# Patient Record
Sex: Male | Born: 1955 | Race: Black or African American | Hispanic: No | Marital: Married | Smoking: Former smoker
Health system: Southern US, Community
[De-identification: ages and names within clinical notes are randomized; demographics above are authoritative.]

## PROBLEM LIST (undated history)

## (undated) DIAGNOSIS — S0590XA Unspecified injury of unspecified eye and orbit, initial encounter: Secondary | ICD-10-CM

## (undated) DIAGNOSIS — I10 Essential (primary) hypertension: Secondary | ICD-10-CM

## (undated) DIAGNOSIS — C801 Malignant (primary) neoplasm, unspecified: Secondary | ICD-10-CM

## (undated) DIAGNOSIS — K7460 Unspecified cirrhosis of liver: Secondary | ICD-10-CM

## (undated) DIAGNOSIS — N200 Calculus of kidney: Secondary | ICD-10-CM

## (undated) DIAGNOSIS — K759 Inflammatory liver disease, unspecified: Secondary | ICD-10-CM

## (undated) HISTORY — PX: ABDOMINAL SURGERY: SHX537

## (undated) HISTORY — PX: OTHER SURGICAL HISTORY: SHX169

---
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
## 1998-08-24 DIAGNOSIS — K759 Inflammatory liver disease, unspecified: Secondary | ICD-10-CM

## 1998-08-24 HISTORY — PX: LEG SURGERY: SHX1003

## 1998-08-24 HISTORY — DX: Inflammatory liver disease, unspecified: K75.9

## 2000-05-24 ENCOUNTER — Encounter: Admission: RE | Admit: 2000-05-24 | Discharge: 2000-07-08 | Payer: Self-pay | Admitting: *Deleted

## 2000-06-30 ENCOUNTER — Encounter: Payer: Self-pay | Admitting: Emergency Medicine

## 2000-06-30 ENCOUNTER — Emergency Department (HOSPITAL_COMMUNITY): Admission: EM | Admit: 2000-06-30 | Discharge: 2000-06-30 | Payer: Self-pay | Admitting: Emergency Medicine

## 2000-07-20 ENCOUNTER — Encounter: Admission: RE | Admit: 2000-07-20 | Discharge: 2000-07-20 | Payer: Self-pay | Admitting: Urology

## 2000-07-20 ENCOUNTER — Encounter: Payer: Self-pay | Admitting: Urology

## 2002-04-27 ENCOUNTER — Emergency Department (HOSPITAL_COMMUNITY): Admission: EM | Admit: 2002-04-27 | Discharge: 2002-04-27 | Payer: Self-pay | Admitting: Emergency Medicine

## 2002-04-27 ENCOUNTER — Encounter: Payer: Self-pay | Admitting: Emergency Medicine

## 2002-05-08 ENCOUNTER — Ambulatory Visit (HOSPITAL_BASED_OUTPATIENT_CLINIC_OR_DEPARTMENT_OTHER): Admission: RE | Admit: 2002-05-08 | Discharge: 2002-05-08 | Payer: Self-pay | Admitting: Urology

## 2002-05-08 ENCOUNTER — Encounter: Payer: Self-pay | Admitting: Urology

## 2002-09-22 ENCOUNTER — Emergency Department (HOSPITAL_COMMUNITY): Admission: EM | Admit: 2002-09-22 | Discharge: 2002-09-22 | Payer: Self-pay | Admitting: Emergency Medicine

## 2003-01-21 ENCOUNTER — Encounter: Payer: Self-pay | Admitting: Emergency Medicine

## 2003-01-21 ENCOUNTER — Emergency Department (HOSPITAL_COMMUNITY): Admission: EM | Admit: 2003-01-21 | Discharge: 2003-01-21 | Payer: Self-pay | Admitting: Emergency Medicine

## 2003-05-09 ENCOUNTER — Emergency Department (HOSPITAL_COMMUNITY): Admission: EM | Admit: 2003-05-09 | Discharge: 2003-05-09 | Payer: Self-pay | Admitting: Emergency Medicine

## 2003-05-09 ENCOUNTER — Encounter: Payer: Self-pay | Admitting: Emergency Medicine

## 2003-07-08 ENCOUNTER — Emergency Department (HOSPITAL_COMMUNITY): Admission: EM | Admit: 2003-07-08 | Discharge: 2003-07-08 | Payer: Self-pay | Admitting: Emergency Medicine

## 2005-04-30 ENCOUNTER — Emergency Department (HOSPITAL_COMMUNITY): Admission: EM | Admit: 2005-04-30 | Discharge: 2005-05-01 | Payer: Self-pay | Admitting: Emergency Medicine

## 2007-01-21 ENCOUNTER — Emergency Department (HOSPITAL_COMMUNITY): Admission: EM | Admit: 2007-01-21 | Discharge: 2007-01-21 | Payer: Self-pay | Admitting: Emergency Medicine

## 2007-05-05 ENCOUNTER — Ambulatory Visit: Payer: Self-pay | Admitting: Gastroenterology

## 2007-05-05 LAB — CONVERTED CEMR LAB
Basophils Absolute: 0 10*3/uL (ref 0.0–0.1)
Basophils Relative: 0.6 % (ref 0.0–1.0)
Hemoglobin: 15.1 g/dL (ref 13.0–17.0)
INR: 1.2 — ABNORMAL HIGH (ref 0.8–1.0)
MCHC: 34.2 g/dL (ref 30.0–36.0)
Monocytes Relative: 6.8 % (ref 3.0–11.0)
Platelets: 139 10*3/uL — ABNORMAL LOW (ref 150–400)
Prothrombin Time: 13.5 s — ABNORMAL HIGH (ref 10.9–13.3)
RBC: 4.83 M/uL (ref 4.22–5.81)
RDW: 12.9 % (ref 11.5–14.6)
aPTT: 29 s (ref 21.7–29.8)

## 2007-05-09 ENCOUNTER — Inpatient Hospital Stay (HOSPITAL_COMMUNITY): Admission: EM | Admit: 2007-05-09 | Discharge: 2007-05-12 | Payer: Self-pay | Admitting: Emergency Medicine

## 2007-05-09 ENCOUNTER — Ambulatory Visit: Payer: Self-pay | Admitting: Infectious Diseases

## 2007-06-02 ENCOUNTER — Encounter: Payer: Self-pay | Admitting: Gastroenterology

## 2007-06-02 ENCOUNTER — Ambulatory Visit: Payer: Self-pay | Admitting: Gastroenterology

## 2007-11-10 DIAGNOSIS — D126 Benign neoplasm of colon, unspecified: Secondary | ICD-10-CM

## 2007-11-10 DIAGNOSIS — R011 Cardiac murmur, unspecified: Secondary | ICD-10-CM

## 2007-11-10 DIAGNOSIS — K219 Gastro-esophageal reflux disease without esophagitis: Secondary | ICD-10-CM

## 2007-11-10 DIAGNOSIS — R51 Headache: Secondary | ICD-10-CM

## 2007-11-10 DIAGNOSIS — G4730 Sleep apnea, unspecified: Secondary | ICD-10-CM | POA: Insufficient documentation

## 2007-11-10 DIAGNOSIS — R519 Headache, unspecified: Secondary | ICD-10-CM | POA: Insufficient documentation

## 2007-11-10 DIAGNOSIS — K921 Melena: Secondary | ICD-10-CM

## 2007-11-10 DIAGNOSIS — K648 Other hemorrhoids: Secondary | ICD-10-CM | POA: Insufficient documentation

## 2007-11-10 DIAGNOSIS — F329 Major depressive disorder, single episode, unspecified: Secondary | ICD-10-CM

## 2007-11-10 DIAGNOSIS — B1710 Acute hepatitis C without hepatic coma: Secondary | ICD-10-CM | POA: Insufficient documentation

## 2007-11-10 DIAGNOSIS — S82899A Other fracture of unspecified lower leg, initial encounter for closed fracture: Secondary | ICD-10-CM | POA: Insufficient documentation

## 2010-04-24 ENCOUNTER — Encounter (INDEPENDENT_AMBULATORY_CARE_PROVIDER_SITE_OTHER): Payer: Self-pay | Admitting: *Deleted

## 2010-09-23 NOTE — Letter (Signed)
Summary: Colonoscopy Letter  [facility]  [address] [place], [place] [address]   Phone: [phone]  Fax: [phone]      April 24, 2010 MRN: [id number]   CLEOPHUS MENDONSA [address] [place], [place]  [address]   Dear Mr. Crader,   According to your medical record, it is time for you to schedule a Colonoscopy. The American Cancer Society recommends this procedure as a method to detect early colon cancer. Patients with a family history of colon cancer, or a personal history of colon polyps or inflammatory bowel disease are at increased risk.  This letter has beeen generated based on the recommendations made at the time of your procedure. If you feel that in your particular situation this may no longer apply, please contact our office.  Please call our office at [phone] to schedule this appointment or to update your records at your earliest convenience.  Thank you for cooperating with [place] to provide you with the very best care possible.   Sincerely,  Judie Petit T. Russella Dar, M.D.  [facility] [phone]

## 2010-11-07 ENCOUNTER — Emergency Department (HOSPITAL_COMMUNITY)
Admission: EM | Admit: 2010-11-07 | Discharge: 2010-11-07 | Disposition: A | Discharge status: Home / Self Care | Payer: No Typology Code available for payment source | Attending: Emergency Medicine | Admitting: Emergency Medicine

## 2010-11-07 ENCOUNTER — Emergency Department (HOSPITAL_COMMUNITY): Payer: No Typology Code available for payment source

## 2010-11-07 DIAGNOSIS — M25559 Pain in unspecified hip: Secondary | ICD-10-CM | POA: Insufficient documentation

## 2010-11-07 DIAGNOSIS — B1920 Unspecified viral hepatitis C without hepatic coma: Secondary | ICD-10-CM | POA: Insufficient documentation

## 2010-11-07 DIAGNOSIS — E119 Type 2 diabetes mellitus without complications: Secondary | ICD-10-CM | POA: Insufficient documentation

## 2010-11-07 DIAGNOSIS — I10 Essential (primary) hypertension: Secondary | ICD-10-CM | POA: Insufficient documentation

## 2010-11-07 LAB — COMPREHENSIVE METABOLIC PANEL
ALT: 108 U/L — ABNORMAL HIGH (ref 0–53)
AST: 86 U/L — ABNORMAL HIGH (ref 0–37)
Albumin: 3.3 g/dL — ABNORMAL LOW (ref 3.5–5.2)
BUN: 12 mg/dL (ref 6–23)
Chloride: 101 mEq/L (ref 96–112)
GFR calc Af Amer: >60 mL/min (ref >60)
GFR calc non Af Amer: >60 mL/min (ref >60)
Potassium: 3.9 mEq/L (ref 3.5–5.1)
Sodium: 135 mEq/L (ref 135–145)
Total Protein: 6.9 g/dL (ref 6.0–8.3)

## 2010-11-07 LAB — CBC
MCH: 31.6 pg (ref 26.0–34.0)
Platelets: 68 10*3/uL — ABNORMAL LOW (ref 150–400)
RBC: 3.86 MIL/uL — ABNORMAL LOW (ref 4.22–5.81)
RDW: 13.1 % (ref 11.5–15.5)

## 2010-11-07 LAB — DIFFERENTIAL
Basophils Absolute: 0 10*3/uL (ref 0.0–0.1)
Eosinophils Relative: 0 % (ref 0–5)
Monocytes Absolute: 0.3 10*3/uL (ref 0.1–1.0)
Monocytes Relative: 9 % (ref 3–12)
Neutrophils Relative %: 19 % — ABNORMAL LOW (ref 43–77)

## 2010-11-07 LAB — PATHOLOGIST SMEAR REVIEW

## 2011-01-06 NOTE — Discharge Summary (Signed)
Stuart Moore, Stuart Moore NO.:  [id number]   MEDICAL RECORD NO.:  [id number]          PATIENT TYPE:  INP   LOCATION:  3711                         FACILITY:  [facility]   PHYSICIAN:  Edsel Petrin, D.O.DATE OF BIRTH:  [birth year]   DATE OF ADMISSION:  05/09/2007  DATE OF DISCHARGE:  05/12/2007                               DISCHARGE SUMMARY   DISCHARGE DIAGNOSES:  1. Hyperosmolar hypoglycemia state.  2. Diabetes mellitus, type 2, new diagnosis.  3. Probable vital myocytes with creatinine kinase elevation.  4. Diabetic retinopathy secondary to #1.  5. Thrombocytopenia, likely secondary to history of      interferon/ribavirin therapy.  6. Hepatitis C, last interferon/ribavirin therapy in June 2008.  7. Hypernatremia secondary to #1, resolved.  8. Elevated liver function tests secondary to hepatitis C.  9. Prerenal azotemia secondary to #1.  10.Neuropsychiatric symptoms/delirium secondary to #1.  11.Right leg ulcer, post fracture repair.  12.Group B streptococcal urinary tract infection.  13.Balanitis.  14.History of tobacco abuse.  15.Remote history of polysubstance abuse (alcohol and cocaine).  16.History of right hip replacement.  17.History of right eye trauma and blindness.   DISCHARGE MEDICATIONS:  1. Lantus injectable insulin pen, titration scale given, start with 20      units daily.  2. NovoLog injectable insulin 10, sliding scale will be provided for      meal coverage.  3. Amoxicillin 500 mg p.o. three times daily for 10 days.  4. Percocet 5 mg q.6 h p.r.n. for orthopedic pain.  5. Colace 100 mg p.o. daily for constipation.  6. Calcium and vitamin D supplement daily.  7. Omeprazole 20 mg p.o. daily.   DISPOSITION/FOLLOWUP:  Stuart Moore is being discharged in stable and  improved condition.  He has an appointment on Friday, May 13, 2007, at the [facility] with his orthopedist who is managing his right  lower extremity fracture.  Mr.  Alter has a primary care physician at  the [facility] with whom he will follow up with in the next 2-  3 days.  I will provide him with a copy of his discharge summary with  specific details for discharge outpatient care to be coordinated by the  [facility] practitioners.   ISSUES AT FOLLOWUP FOR PRIMARY CARE PHYSICIAN:  1. The patient is being discharged from the hospital with a Lantus      starter pack, Novolog sliding scale insulin starter pack and      glucometer with a limited amount of diabetes management supplies to      get him to his appointment with primary care at the [facility].  At the [facility],  he will need to be entered into a diabetes      education program, and his primary care physician can choose his      insulin or oral medications of choice in order to control his blood      sugars.  He is likely in a stunned state following his severe      hyperglycemia and hyperosmolality.  Therefore, we are recommending  several weeks on injectable insulin.  2. Urine culture while inpatient revealed Group B streptococcal      growth.  He will be treated with amoxicillin for 10 days starting      on September 17, ending September 27.  Repeat testing is      recommended if he is symptomatic.  3. The patient is to follow up with his orthopedic issues including a      right lower extremity wound and orthopedic hardware.  4. [name] will need to be seen at his Hepatis C Liver Clinic at      the [facility].  His current issues may be closely related to his      hepatitis C infection as well as his treatment with      interferon/ribivarin.  Comment:  Note, thrombocytopenia and      abnormal liver function tests during his hospitalization.   BRIEF ADMISSION HISTORY AND PHYSICAL:  Stuart Moore is a 55 year old  veteran with a past medical history significant for polysubstance abuse,  hepatitis C, GERD and multiple recent fractures to his lower  extremities  who was admitted to the [facility] after being evaluated in the  emergency department for capillary blood glucose readings of greater  than 1200.  He had complained of a viral-like illness prior to coming to  the emergency department for 2-3 days.  He has chronic bilateral lower  leg pain.  He denied any chest pain at the time of admission.  He did  complain of severe polyuria, polydipsia for the proceeding three days.  There was no hematuria, dysuria, pyuria.  He complained of fevers  greater than 100 and 101 at home.  He had a sore throat and some  shortness of breath as well as generalized muscle aches.  His family was  concerned that he was becoming very dehydrated and was having some  mental status changes.  Therefore, they brought him in for evaluation.   PHYSICAL EXAMINATION:  VITAL SIGNS:  On admission, his temperature was  98.3, blood pressure 119/83, pulse 121, respirations 18, O2 saturation  96% on room air.  GENERAL:  In no acute distress.  Obviously confused and disoriented.  HEENT:  Eye exam revealed trauma to his right eye, chronic.  His oral  mucosa was extremely dry with some evidence of friability and bleeding.  He had dry crackles in his lung bases, but there was no wheezing and  fair air movement.  His heart was regular with rate and rhythm with no  murmurs.  He was mildly tachycardic.  ABDOMEN:  Soft, mildly tender throughout with enlarged liver.  There was  no guarding or rebound.  Both of his legs were tender, especially on his  volar shins.  There was a 1 x 3 cm clean ulcer on his right lateral  malleolus.  He had minimal cervical adenopathy tender throughout his  paraspinals on his lower back.   LABORATORY DATA:  On admission, urinalysis:  Glucose greater than 1000,  ketones negative, trace blood, 0-2 RBCs, 0-2 WBCs.  Sodium 142,  potassium 5.4, note hemolysis, chloride 100, bicarbonate 29, BUN 47,  creatinine 2.4, glucose 1243.  WBC 13.1,  hemoglobin 17.1, platelets 129.   EKG on admission showed biatrial enlargement with sinus tachycardia.   HOSPITAL COURSE:  #1 - HYPEROSMOLAR HYPERGLYCEMIA STATE.  Mr. Oshita  had extremely elevated blood glucose levels on admission.  He was not a  previously known diabetic.  He was symptomatic  over several days.  He  was actually volume resuscitated 2-3 liters with normal saline.  He was  placed on a glucomander for stepwise control in lowering of his blood  glucose.  This was effective, and q.1 h CBGs and q.4 h B-mets were  monitored closely by the medical team.  Potassium was supplemented as  needed in his IV fluids.  He did have some transient hypernatremia  associated with correction of his electrolytes.  Therefore, his fluids  were changed to half normal saline.  As his hydration status improved,  his sodium did correct.  During this time, he did have some  neuropsychiatric symptoms which resolved with correction of his glucose  and sodium and additional electrolytes.  He may have been having  symptoms of PTSD with active auditory and visual hallucinations.  There  was no worrisome or combative behavior.  One day prior to his discharge,  he demonstrated consistently stable blood glucose control on sliding  scale insulin and long-acting Lantus at 20 units.  He has a normal basic  metabolic panel.  He did have prerenal azotemia with a creatinine of 2.4  at admission secondary to his severe volume loss due to hyperosmolar  diuresis.  This rapidly corrected with the infusion of IV fluids.  In  terms of an underlying source for precipitating his hyperosmolar  hyperglycemia, we performed blood cultures which were negative.  Urine  cultures did grow group B strep for which he is undergoing treatment  with amoxicillin.  There was no evidence of any infection regarding his  orthopedic injuries.  He may have also had a viral infection preceding  this event as he had transient elevation in  his creatinine kinase with a  CK level of 4000 which at the time of discharge is trending downward.  On chest x-ray, there was atelectasis and we could not rule out the  presence of an infiltrate.  Therefore, he was empirically treated for  two days with Avelox which was discontinued once the source of infection  was ruled out.  It may also be likely that his interferon/ribivarin  therapy with possible recurrence of his hepatitis C may have created a  flare of diabetes mellitus.   #2 - HEPATITIS C.  Mr.  Holzheimer recently completed one year of therapy  of interferon/ribavirin with his last dose being in June 2008.  He  states that since this continuing therapy, he has felt well until this  point.  His plans were to follow with the liver clinic in the outpatient  setting.  It was noted during his hospitalization that he had elevated  liver enzymes with an AST of 240 and ALT of 124 and an alk-phos of 129.  These elevations may be secondary to elevations in his creatinine  kinase.  Although, he did not appear to have actual rhabdomyolysis.  On  the day of admission, Mr. Snelling alk-phos was 256, his AST was 60,  ALT was 98, total protein 9.8, albumin 1.0.  We will perform hepatitis C  viral load and forward those results to the [facility] hospital for follow up as  his infection may not be responding to treatment.   #3 - THROMBOCYTOPENIA.  On admission, Mr. Zurn platelets were at  129.  Throughout his stay, they have decreased to 88.  This may be  multifactorial secondary to his liver disease.  I will also stop him on  his anticoagulation.  These will need to be followed again in the  outpatient setting.   #4 - ORTHOPEDIC ISSUES.  Mr. Barringer has chronic pain secondary to  many orthopedic injuries both historic and current.  He has a partially  healed right leg ulcer.  He is evaluated by the wound care team.  He  appears to be doing an excellent job at home caring for this wound.   He  has follow up with the orthopedist at the [facility] for  fracture repair of his right lower extremity.   DISCHARGE LABORATORIES:  Sodium 139, potassium 4.5, chloride 111,  bicarbonate 24, BUN 6, creatinine 0.9, glucose 300, alk-phos 129, AST  240, ALT 124, protein 7.6, albumin 3.0.  White blood cell count 10.2,  hemoglobin 14.3, platelets 88, cholesterol 137, LDL 76, HDL 60,  triglycerides 226.   DISCHARGE VITAL SIGNS:  Temperature 99, blood pressure 112/72, pulse 98,  respirations 20.      Edsel Petrin, D.O.  Electronically Signed     ELG/MEDQ  D:  05/11/2007  T:  05/11/2007  Job:  [id number]   cc:   [facility]

## 2011-01-06 NOTE — Consult Note (Signed)
NAME:  Stuart Moore, Stuart Moore NO.:  [id number]   MEDICAL RECORD NO.:  [id number]          PATIENT TYPE:  EMS   LOCATION:  ED                           FACILITY:  [facility]   PHYSICIAN:  Erasmo Leventhal, M.D.DATE OF BIRTH:  [birth year]   DATE OF CONSULTATION:  DATE OF DISCHARGE:                                 CONSULTATION   TIME:  10:30 p.m.   HISTORY OF PRESENT ILLNESS:  Stuart Moore is a 55 year old male  who fell tonight from a stage while watching his daughter in a  graduation event.  He injured his right ankle, could not ambulate on it.  He presented to [facility] emergency room.  He was evaluated by the ED  staff.  I was consulted for a right ankle fracture.  He complains only  of right ankle pain.  He denies injury or complaints of pain elsewhere.   ALLERGIES:  None.   CURRENT MEDICATIONS:  Vicodin, and he is also on hepatitis C treatment  through the [facility].   PAST MEDICAL HISTORY:  Hepatitis C as noted above, also right lower  extremity orthopedic procedures, multiple, at the [facility] where he  is very familiar with the people there.   PHYSICAL EXAMINATION:  GENERAL:  He is awake, alert, oriented to person,  place, time and circumstance.  Only complaint is right ankle pain.  EXTREMITIES:  Right lower extremity evaluated.  Knee:  Old scars; no  acute abnormality.  Leg:  Unremarkable.  Ankle:  Mild swelling, mild  lateral deformity.  Neurovascular examination is intact.  Compartments  are soft.  Achilles appeared to be intact.   X-ray is reviewed.  He has a trimalleolar ankle fracture/subluxation.   IMPRESSION:  Right ankle trimalleolar fracture/subluxation.   RECOMMENDATIONS:  I discussed with [name] that since he has had  something to drink at 7:00 nontender that he would be unable to undergo  anesthesia tonight until several hours from now.  With that in mind he  also asked me if he could go to the [facility], where he has  had all  of his other orthopedic treatment.  With that in mind we discussed  closed reduction.  He and his wife agreed to the procedure.   IV Dilaudid, 10 mL of 1% lidocaine, interarticular block, closed  reduction.  Better alignment, palpable and audible reduction.  No  neurovascular examination.   PLAN:  Closed reduction is done as above.  Elevation.  Ice.  Nonweightbearing.  Crutches will be dispensed.  Neurovascular checks.  Call for problems.  Prescription for Percocet and Robaxin.  DC  his  Vicodin for now.  Follow up on Monday either with me or the [facility]  for definitive treatment.  All questions encouraged and answered to the  patient and his wife in detail.           ______________________________  Erasmo Leventhal, M.D.     RAC/MEDQ  D:  01/21/2007  T:  01/22/2007  Job:  [id number]

## 2011-01-09 NOTE — Assessment & Plan Note (Signed)
[facility]                         GASTROENTEROLOGY OFFICE NOTE   DAL, BLEW                  MRN:          [id number]  DATE:05/05/2007                            DOB:          09-24-1955    PHYSICIAN REQUESTING CONSULTATION:  Dr. Caprice Renshaw   REASON FOR CONSULTATION:  Hemoccult positive stool. Family history of  colon cancer; GERD.   HISTORY OF PRESENT ILLNESS:  [name] is a 55 year old African-  American male who has a strong family history of colon cancer; he has  two brothers who developed colon cancer in their 59's.  He has another  brother who has had benign colon polyps.  He has had chronic GERD since  the 1980's and he is currently well controlled on omeprazole.  He has no  change in bowel habits, change in stool caliber, diarrhea, constipation,  melena, hematochezia, weight loss, nausea, vomiting, dysphagia or  odynophagia.  Recent stool Hemoccults were obtained during a physical  examination which were positive.   PAST MEDICAL HISTORY:  1. Hepatitis C status post 12 months of interferon and ribavirin;      recently completed.  2. GERD.  3. Status post recent right lower extremity fracture.   CURRENT MEDICATIONS:  Listed on the chart.   ALLERGIES:  No known drug allergies.   SOCIAL HISTORY/REVIEW OF SYSTEMS:  Per the handwritten form.   PHYSICAL EXAMINATION:  GENERAL:  Well-developed, well-nourished African-  American male in no acute distress.  VITAL SIGNS:  Height 5 feet, 8 inches, weight 161 pounds. Blood pressure  is 162/86, pulse 80 and regular.  HEENT EXAM:  Anicteric sclerae. Oropharynx clear.  CHEST:  Clear to auscultation bilaterally.  CARDIAC:  Regular rate and rhythm without murmurs appreciated.  ABDOMEN:  Soft and nontender with normal active bowel sounds, no  palpable organomegaly, masses or hernias.  The left lobe of the liver is  palpable in the epigastrium.  RECTAL EXAMINATION:   Deferred to time of colonoscopy.  NEUROLOGIC:  Alert and oriented x3.  Grossly nonfocal.   ASSESSMENT AND PLAN:  Hemoccult positive stool, colon cancer in two  brothers and chronic GERD. Rule out colorectal neoplasms, erosive  esophagitis, Barrett's esophagus and other disorders.  Will obtain a CBC  and PT, PTT today given his history of liver disease.  Risks, benefits  and alternatives to colonoscopy, possible biopsy, possible polypectomy  and upper  endoscopy with possible biopsy discussed with the patient and he  consents to proceed.  This will be scheduled electively.     Venita Lick. Russella Dar, MD, [facility]  Electronically Signed    MTS/MedQ  DD: 05/06/2007  DT: 05/07/2007  Job #: [id number]   cc:   Caprice Renshaw, M.D.

## 2011-06-04 LAB — BASIC METABOLIC PANEL
BUN: 14
BUN: 17
BUN: 25 — ABNORMAL HIGH
BUN: 34 — ABNORMAL HIGH
BUN: 36 — ABNORMAL HIGH
BUN: 37 — ABNORMAL HIGH
BUN: 39 — ABNORMAL HIGH
BUN: 40 — ABNORMAL HIGH
BUN: 45 — ABNORMAL HIGH
BUN: 47 — ABNORMAL HIGH
BUN: 7
BUN: 9
CO2: 22
CO2: 24
CO2: 24
CO2: 26
CO2: 26
CO2: 27
CO2: 28
CO2: 29
CO2: 30
Calcium: 11.9 — ABNORMAL HIGH
Calcium: 12.2 — ABNORMAL HIGH
Calcium: 8.1 — ABNORMAL LOW
Calcium: 8.4
Calcium: 8.4
Calcium: 8.5
Calcium: 8.7
Calcium: 8.7
Chloride: 108
Chloride: 111
Chloride: 112
Chloride: 112
Chloride: 113 — ABNORMAL HIGH
Chloride: 114 — ABNORMAL HIGH
Chloride: 115 — ABNORMAL HIGH
Chloride: 118 — ABNORMAL HIGH
Creatinine, Ser: 0.81
Creatinine, Ser: 0.86
Creatinine, Ser: 0.92
Creatinine, Ser: 0.99
Creatinine, Ser: 1.11
Creatinine, Ser: 1.26
Creatinine, Ser: 1.6 — ABNORMAL HIGH
Creatinine, Ser: 1.78 — ABNORMAL HIGH
Creatinine, Ser: 1.93 — ABNORMAL HIGH
Creatinine, Ser: 2.43 — ABNORMAL HIGH
GFR calc Af Amer: >60
GFR calc Af Amer: >60
GFR calc Af Amer: >60
GFR calc Af Amer: >60
GFR calc Af Amer: >60
GFR calc non Af Amer: 28 — ABNORMAL LOW
GFR calc non Af Amer: 37 — ABNORMAL LOW
GFR calc non Af Amer: 40 — ABNORMAL LOW
GFR calc non Af Amer: 47 — ABNORMAL LOW
GFR calc non Af Amer: >60
GFR calc non Af Amer: >60
GFR calc non Af Amer: >60
GFR calc non Af Amer: >60
Glucose, Bld: 1243
Glucose, Bld: 378 — ABNORMAL HIGH
Glucose, Bld: 416 — ABNORMAL HIGH
Glucose, Bld: 427 — ABNORMAL HIGH
Glucose, Bld: 462 — ABNORMAL HIGH
Glucose, Bld: 481 — ABNORMAL HIGH
Glucose, Bld: 783
Potassium: 3.5
Potassium: 3.7
Potassium: 3.8
Potassium: 4
Potassium: 4.3
Potassium: 4.3
Sodium: 133 — ABNORMAL LOW
Sodium: 145
Sodium: 147 — ABNORMAL HIGH
Sodium: 148 — ABNORMAL HIGH
Sodium: 157 — ABNORMAL HIGH

## 2011-06-04 LAB — I-STAT 8, (EC8 V) (CONVERTED LAB)
Acid-Base Excess: 2
Glucose, Bld: >700
TCO2: 29
pCO2, Ven: 45.8
pH, Ven: 7.386 — ABNORMAL HIGH

## 2011-06-04 LAB — CBC
HCT: 41
Hemoglobin: 15
MCHC: 33.3
MCHC: 33.4
MCV: 91.7
MCV: 92.3
MCV: 94.3
Platelets: 129 — ABNORMAL LOW
Platelets: 78 — ABNORMAL LOW
RBC: 4.66
RBC: 4.9
RBC: 5.57
RDW: 13.4
RDW: 13.5
WBC: 13.1 — ABNORMAL HIGH
WBC: 13.9 — ABNORMAL HIGH

## 2011-06-04 LAB — STREP A DNA PROBE: Group A Strep Probe: NEGATIVE

## 2011-06-04 LAB — RAPID URINE DRUG SCREEN, HOSP PERFORMED
Amphetamines: NOT DETECTED
Barbiturates: NOT DETECTED
Benzodiazepines: POSITIVE — AB
Cocaine: NOT DETECTED
Opiates: NOT DETECTED

## 2011-06-04 LAB — URINE MICROSCOPIC-ADD ON

## 2011-06-04 LAB — COMPREHENSIVE METABOLIC PANEL
ALT: 124 — ABNORMAL HIGH
ALT: 130 — ABNORMAL HIGH
ALT: 98 — ABNORMAL HIGH
AST: 226 — ABNORMAL HIGH
AST: 240 — ABNORMAL HIGH
Albumin: 2.7 — ABNORMAL LOW
Alkaline Phosphatase: 129 — ABNORMAL HIGH
BUN: 8
CO2: 23
CO2: 25
CO2: 27
Calcium: 11.5 — ABNORMAL HIGH
Calcium: 8.3 — ABNORMAL LOW
Calcium: 8.6
Calcium: 8.8
Chloride: 111
Creatinine, Ser: 0.87
GFR calc Af Amer: >60
GFR calc Af Amer: >60
GFR calc non Af Amer: 32 — ABNORMAL LOW
GFR calc non Af Amer: >60
Glucose, Bld: 1016
Glucose, Bld: 108 — ABNORMAL HIGH
Potassium: 3.1 — ABNORMAL LOW
Potassium: 4.7
Sodium: 140
Sodium: 144
Sodium: 152 — ABNORMAL HIGH
Total Bilirubin: 0.9
Total Protein: 6.2
Total Protein: 6.8

## 2011-06-04 LAB — LACTIC ACID, PLASMA: Lactic Acid, Venous: 3.1 — ABNORMAL HIGH

## 2011-06-04 LAB — PHOSPHORUS: Phosphorus: 2.2 — ABNORMAL LOW

## 2011-06-04 LAB — DIFFERENTIAL
Basophils Relative: 0
Eosinophils Absolute: 0
Lymphs Abs: 3.1
Neutrophils Relative %: 71

## 2011-06-04 LAB — POCT I-STAT CREATININE: Creatinine, Ser: 2.1 — ABNORMAL HIGH

## 2011-06-04 LAB — ETHANOL: Alcohol, Ethyl (B): <5

## 2011-06-04 LAB — CARDIAC PANEL(CRET KIN+CKTOT+MB+TROPI)
CK, MB: 4.1 — ABNORMAL HIGH
CK, MB: 8.3 — ABNORMAL HIGH
Relative Index: 0.2
Relative Index: 0.2
Troponin I: 0.06

## 2011-06-04 LAB — CULTURE, BLOOD (ROUTINE X 2)
Culture: NO GROWTH
Culture: NO GROWTH

## 2011-06-04 LAB — HCV RNA QUANT: HCV Quantitative Log: 4.88 — ABNORMAL HIGH (ref <0.70)

## 2011-06-04 LAB — URINE CULTURE: Colony Count: >=100000

## 2011-06-04 LAB — LIPID PANEL
HDL: 16 — ABNORMAL LOW
LDL Cholesterol: 76
Triglycerides: 226 — ABNORMAL HIGH

## 2011-06-04 LAB — AMMONIA: Ammonia: 21

## 2011-06-04 LAB — CLOSTRIDIUM DIFFICILE EIA

## 2011-06-04 LAB — CK TOTAL AND CKMB (NOT AT ARMC): Total CK: 498 — ABNORMAL HIGH

## 2011-06-04 LAB — URINALYSIS, ROUTINE W REFLEX MICROSCOPIC
Bilirubin Urine: NEGATIVE
Ketones, ur: NEGATIVE
Nitrite: NEGATIVE
Protein, ur: NEGATIVE
Specific Gravity, Urine: 1.036 — ABNORMAL HIGH
Urobilinogen, UA: 0.2

## 2011-06-04 LAB — CREATININE, URINE, RANDOM: Creatinine, Urine: 224.1

## 2011-06-04 LAB — HEMOGLOBIN A1C
Hgb A1c MFr Bld: 10.8 — ABNORMAL HIGH
Mean Plasma Glucose: 307

## 2011-06-04 LAB — PROTIME-INR: INR: 1.2

## 2011-06-04 LAB — HEPATITIS B SURFACE ANTIGEN: Hepatitis B Surface Ag: NEGATIVE

## 2011-06-04 LAB — MAGNESIUM: Magnesium: 2

## 2011-06-04 LAB — WOUND CULTURE: Gram Stain: NONE SEEN

## 2011-06-04 LAB — TSH: TSH: 0.69

## 2011-06-04 LAB — RAPID STREP SCREEN (MED CTR MEBANE ONLY): Streptococcus, Group A Screen (Direct): NEGATIVE

## 2011-11-17 ENCOUNTER — Emergency Department (HOSPITAL_COMMUNITY): Payer: No Typology Code available for payment source

## 2011-11-17 ENCOUNTER — Encounter (HOSPITAL_COMMUNITY): Payer: Self-pay | Admitting: *Deleted

## 2011-11-17 ENCOUNTER — Emergency Department (HOSPITAL_COMMUNITY)
Admission: EM | Admit: 2011-11-17 | Discharge: 2011-11-17 | Disposition: A | Discharge status: Home / Self Care | Payer: No Typology Code available for payment source | Attending: Emergency Medicine | Admitting: Emergency Medicine

## 2011-11-17 DIAGNOSIS — R109 Unspecified abdominal pain: Secondary | ICD-10-CM | POA: Insufficient documentation

## 2011-11-17 DIAGNOSIS — N201 Calculus of ureter: Secondary | ICD-10-CM | POA: Insufficient documentation

## 2011-11-17 DIAGNOSIS — E119 Type 2 diabetes mellitus without complications: Secondary | ICD-10-CM | POA: Insufficient documentation

## 2011-11-17 DIAGNOSIS — I10 Essential (primary) hypertension: Secondary | ICD-10-CM | POA: Insufficient documentation

## 2011-11-17 HISTORY — DX: Essential (primary) hypertension: I10

## 2011-11-17 LAB — CBC
MCV: 91.8 fL (ref 78.0–100.0)
Platelets: 154 10*3/uL (ref 150–400)
RBC: 4.76 MIL/uL (ref 4.22–5.81)
RDW: 13.7 % (ref 11.5–15.5)
WBC: 13.1 10*3/uL — ABNORMAL HIGH (ref 4.0–10.5)

## 2011-11-17 LAB — COMPREHENSIVE METABOLIC PANEL
AST: 60 U/L — ABNORMAL HIGH (ref 0–37)
Albumin: 4.1 g/dL (ref 3.5–5.2)
Alkaline Phosphatase: 120 U/L — ABNORMAL HIGH (ref 39–117)
BUN: 12 mg/dL (ref 6–23)
Creatinine, Ser: 0.99 mg/dL (ref 0.50–1.35)
Potassium: 3.9 mEq/L (ref 3.5–5.1)
Total Protein: 8.7 g/dL — ABNORMAL HIGH (ref 6.0–8.3)

## 2011-11-17 LAB — DIFFERENTIAL
Basophils Absolute: 0 10*3/uL (ref 0.0–0.1)
Lymphs Abs: 8.2 10*3/uL — ABNORMAL HIGH (ref 0.7–4.0)
Monocytes Relative: 7 % (ref 3–12)
Neutrophils Relative %: 28 % — ABNORMAL LOW (ref 43–77)

## 2011-11-17 LAB — URINALYSIS, ROUTINE W REFLEX MICROSCOPIC
Glucose, UA: NEGATIVE mg/dL
Nitrite: NEGATIVE
Protein, ur: NEGATIVE mg/dL
Urobilinogen, UA: 1 mg/dL (ref 0.0–1.0)
pH: 7 (ref 5.0–8.0)

## 2011-11-17 MED ORDER — SODIUM CHLORIDE 0.9 % IV SOLN
1000.0000 mL | Freq: Once | INTRAVENOUS | Status: AC
  Administered 2011-11-17: 1000 mL via INTRAVENOUS

## 2011-11-17 MED ORDER — HYDROMORPHONE HCL PF 1 MG/ML IJ SOLN
1.0000 mg | Freq: Once | INTRAMUSCULAR | Status: AC
  Administered 2011-11-17: 1 mg via INTRAVENOUS
  Filled 2011-11-17: qty 1

## 2011-11-17 MED ORDER — ONDANSETRON HCL 4 MG/2ML IJ SOLN
4.0000 mg | Freq: Once | INTRAMUSCULAR | Status: AC
  Administered 2011-11-17: 4 mg via INTRAVENOUS
  Filled 2011-11-17: qty 2

## 2011-11-17 MED ORDER — ONDANSETRON HCL 4 MG PO TABS: 4.0000 mg | ORAL_TABLET | Freq: Four times a day (QID) | ORAL | Status: AC

## 2011-11-17 MED ORDER — OXYCODONE-ACETAMINOPHEN 5-325 MG PO TABS: 1.0000 | ORAL_TABLET | Freq: Four times a day (QID) | ORAL | Status: AC | PRN

## 2011-11-17 MED ORDER — SODIUM CHLORIDE 0.9 % IV SOLN
1000.0000 mL | INTRAVENOUS | Status: DC
  Administered 2011-11-17: 1000 mL via INTRAVENOUS

## 2011-11-17 NOTE — Discharge Instructions (Signed)

## 2011-11-17 NOTE — ED Notes (Signed)
Pt from home via [facility] pt c/o L flank pain radiating to LUQ of abd onset today @ 19:00, pt denies CP, SOB, pt c/o N/V onset upon arrival to ED, will continue to monitor, prior to arrival pt took Oxycodone Rx for pain, NAD

## 2011-11-17 NOTE — ED Provider Notes (Signed)
History     CSN: [id number]  Arrival date & time 11/17/11  2002   First MD Initiated Contact with Patient 11/17/11 2013      Chief Complaint  Patient presents with  . Flank Pain    (Consider location/radiation/quality/duration/timing/severity/associated sxs/prior treatment) Patient is a 56 y.o. male presenting with flank pain. The history is provided by the patient.  Flank Pain This is a new problem. The current episode started 1 to 2 hours ago. The problem occurs constantly. The problem has not changed since onset.Associated symptoms include abdominal pain. Pertinent negatives include no headaches and no shortness of breath. The symptoms are aggravated by nothing. The symptoms are relieved by nothing. Treatments tried: oxycodone. The treatment provided no relief.  THe pain is in the left flank.  It radiates form the front to the back.  No trouble urinating.  No blood in the urine.  Past Medical History  Diagnosis Date  . Diabetes mellitus   . Hypertension     No past surgical history on file.  No family history on file.  History  Substance Use Topics  . Smoking status: Not on file  . Smokeless tobacco: Not on file  . Alcohol Use:       Review of Systems  Respiratory: Negative for shortness of breath.   Gastrointestinal: Positive for abdominal pain.  Genitourinary: Positive for flank pain.  Neurological: Negative for headaches.  All other systems reviewed and are negative.    Allergies  Review of patient's allergies indicates not on file.  Home Medications  No current outpatient prescriptions on file.  BP 158/83  Pulse 60  Temp(Src) 97.9 F (36.6 C) (Oral)  Resp 20  SpO2 100%  Physical Exam  Nursing note and vitals reviewed. Constitutional: He appears well-developed and well-nourished. No distress.  HENT:  Head: Normocephalic and atraumatic.  Right Ear: External ear normal.  Left Ear: External ear normal.  Eyes: Conjunctivae are normal. Right eye  exhibits no discharge. Left eye exhibits no discharge. No scleral icterus.  Neck: Neck supple. No tracheal deviation present.  Cardiovascular: Normal rate, regular rhythm and intact distal pulses.   Pulmonary/Chest: Effort normal and breath sounds normal. No stridor. No respiratory distress. He has no wheezes. He has no rales.  Abdominal: Soft. Bowel sounds are normal. He exhibits no distension. There is tenderness in the left upper quadrant. There is CVA tenderness. There is no rebound and no guarding.  Musculoskeletal: He exhibits no edema and no tenderness.  Neurological: He is alert. He has normal strength. No sensory deficit. Cranial nerve deficit:  no gross defecits noted. He exhibits normal muscle tone. He displays no seizure activity. Coordination normal.  Skin: Skin is warm and dry. No rash noted.  Psychiatric: He has a normal mood and affect.    ED Course  Procedures (including critical care time)  Medications  0.9 %  sodium chloride infusion (1000 mL Intravenous New Bag/Given 11/17/11 2029)    Followed by  0.9 %  sodium chloride infusion (not administered)  oxycodone (OXY-IR) 5 MG capsule (not administered)  insulin glargine (LANTUS) 100 UNIT/ML injection (not administered)  OMEPRAZOLE PO (not administered)  PRESCRIPTION MEDICATION (not administered)  PRESCRIPTION MEDICATION (not administered)  oxyCODONE-acetaminophen (PERCOCET) 5-325 MG per tablet (not administered)  ondansetron (ZOFRAN) 4 MG tablet (not administered)  HYDROmorphone (DILAUDID) injection 1 mg (1 mg Intravenous Given 11/17/11 2032)  ondansetron (ZOFRAN) injection 4 mg (4 mg Intravenous Given 11/17/11 2031)    Labs Reviewed  CBC - Abnormal;  Notable for the following:    WBC 13.1 (*)    All other components within normal limits  DIFFERENTIAL - Abnormal; Notable for the following:    Neutrophils Relative 28 (*)    Lymphocytes Relative 63 (*)    Lymphs Abs 8.2 (*)    All other components within normal limits    COMPREHENSIVE METABOLIC PANEL - Abnormal; Notable for the following:    Glucose, Bld 163 (*)    Total Protein 8.7 (*)    AST 60 (*) HEMOLYSIS AT THIS LEVEL MAY AFFECT RESULT   ALT 56 (*)    Alkaline Phosphatase 120 (*)    GFR calc non Af Amer 90 (*)    All other components within normal limits  URINALYSIS, ROUTINE W REFLEX MICROSCOPIC - Abnormal; Notable for the following:    Hgb urine dipstick MODERATE (*)    All other components within normal limits  LIPASE, BLOOD  URINE MICROSCOPIC-ADD ON   Ct Abdomen Pelvis Wo Contrast  11/17/2011  *RADIOLOGY REPORT*  Clinical Data: 56 year old male with left flank, abdominal and pelvic pain.  CT ABDOMEN AND PELVIS WITHOUT CONTRAST  Technique:  Multidetector CT imaging of the abdomen and pelvis was performed following the standard protocol without intravenous contrast.  Comparison: 05/09/2007  Findings: The liver has a nodular contour compatible with cirrhosis. No focal hepatic abnormalities are identified. The spleen, pancreas, adrenal glands, and gallbladder are unremarkable.  Mild to moderate left hydroureteronephrosis is identified caused by a 5 mm mid left ureteral calculus, above the iliac crossover.  Multiple nonobstructing bilateral renal calculi are identified  Please note that parenchymal abnormalities may be missed as intravenous contrast was not administered. No free fluid, enlarged lymph nodes, biliary dilation or abdominal aortic aneurysm identified. The bowel, appendix and bladder are unremarkable.  No acute or suspicious bony abnormalities are identified.  Surgical hardware within the proximal right femur is noted.  IMPRESSION: 5 mm mid left ureteral calculus causing mild to moderate left hydroureteronephrosis.  Nonobstructing bilateral renal calculi.  Cirrhosis.  Original Report Authenticated By: Rosendo Gros, M.D.     1. Ureteral stone       MDM  Patient is feeling better after treatment in emergency room for renal colic. He is no  longer having any pain. I discussed the findings with the patient and his family members. He'll be discharged home with a prescription for oxycodone and Zofran. I have given him a referral to urology.       Celene Kras, MD 11/17/11 2245

## 2011-11-18 ENCOUNTER — Encounter (HOSPITAL_COMMUNITY): Payer: Self-pay | Admitting: *Deleted

## 2011-11-18 ENCOUNTER — Ambulatory Visit (HOSPITAL_COMMUNITY)
Admission: AD | Admit: 2011-11-18 | Discharge: 2011-11-18 | Disposition: A | Discharge status: Home / Self Care | Payer: No Typology Code available for payment source | Source: Ambulatory Visit | Attending: Urology | Admitting: Urology

## 2011-11-18 ENCOUNTER — Other Ambulatory Visit: Payer: Self-pay

## 2011-11-18 ENCOUNTER — Other Ambulatory Visit: Payer: Self-pay | Admitting: Urology

## 2011-11-18 ENCOUNTER — Ambulatory Visit (HOSPITAL_COMMUNITY): Payer: No Typology Code available for payment source | Admitting: Anesthesiology

## 2011-11-18 ENCOUNTER — Encounter (HOSPITAL_COMMUNITY)
Admission: AD | Disposition: A | Discharge status: Home / Self Care | Payer: Self-pay | Source: Ambulatory Visit | Attending: Urology

## 2011-11-18 ENCOUNTER — Encounter (HOSPITAL_COMMUNITY): Payer: Self-pay | Admitting: Anesthesiology

## 2011-11-18 ENCOUNTER — Ambulatory Visit (HOSPITAL_COMMUNITY): Payer: No Typology Code available for payment source

## 2011-11-18 DIAGNOSIS — Z01812 Encounter for preprocedural laboratory examination: Secondary | ICD-10-CM | POA: Insufficient documentation

## 2011-11-18 DIAGNOSIS — B1920 Unspecified viral hepatitis C without hepatic coma: Secondary | ICD-10-CM | POA: Insufficient documentation

## 2011-11-18 DIAGNOSIS — Z79899 Other long term (current) drug therapy: Secondary | ICD-10-CM | POA: Insufficient documentation

## 2011-11-18 DIAGNOSIS — Z01810 Encounter for preprocedural cardiovascular examination: Secondary | ICD-10-CM | POA: Insufficient documentation

## 2011-11-18 DIAGNOSIS — E119 Type 2 diabetes mellitus without complications: Secondary | ICD-10-CM | POA: Insufficient documentation

## 2011-11-18 DIAGNOSIS — N201 Calculus of ureter: Secondary | ICD-10-CM

## 2011-11-18 HISTORY — DX: Unspecified cirrhosis of liver: K74.60

## 2011-11-18 HISTORY — DX: Unspecified injury of unspecified eye and orbit, initial encounter: S05.90XA

## 2011-11-18 HISTORY — DX: Inflammatory liver disease, unspecified: K75.9

## 2011-11-18 LAB — GLUCOSE, CAPILLARY
Glucose-Capillary: 113 mg/dL — ABNORMAL HIGH (ref 70–99)
Glucose-Capillary: 165 mg/dL — ABNORMAL HIGH (ref 70–99)

## 2011-11-18 LAB — COMPREHENSIVE METABOLIC PANEL
ALT: 51 U/L (ref 0–53)
AST: 47 U/L — ABNORMAL HIGH (ref 0–37)
Albumin: 4 g/dL (ref 3.5–5.2)
Alkaline Phosphatase: 105 U/L (ref 39–117)
BUN: 10 mg/dL (ref 6–23)
Chloride: 101 mEq/L (ref 96–112)
Potassium: 3.9 mEq/L (ref 3.5–5.1)
Sodium: 139 mEq/L (ref 135–145)
Total Bilirubin: 0.4 mg/dL (ref 0.3–1.2)
Total Protein: 8.5 g/dL — ABNORMAL HIGH (ref 6.0–8.3)

## 2011-11-18 LAB — SURGICAL PCR SCREEN
MRSA, PCR: NEGATIVE
Staphylococcus aureus: NEGATIVE

## 2011-11-18 LAB — PATHOLOGIST SMEAR REVIEW

## 2011-11-18 SURGERY — CYSTOURETEROSCOPY, WITH RETROGRADE PYELOGRAM AND STENT INSERTION
Anesthesia: General | Site: Ureter | Laterality: Left | Wound class: Clean Contaminated

## 2011-11-18 MED ORDER — LIDOCAINE HCL (CARDIAC) 20 MG/ML IV SOLN
INTRAVENOUS | Status: DC | PRN
  Administered 2011-11-18: 80 mg via INTRAVENOUS

## 2011-11-18 MED ORDER — IOHEXOL 300 MG/ML  SOLN
INTRAMUSCULAR | Status: AC
  Filled 2011-11-18: qty 1

## 2011-11-18 MED ORDER — PROMETHAZINE HCL 25 MG/ML IJ SOLN: 6.2500 mg | INTRAMUSCULAR | Status: DC | PRN

## 2011-11-18 MED ORDER — MUPIROCIN 2 % EX OINT
TOPICAL_OINTMENT | CUTANEOUS | Status: AC
  Filled 2011-11-18: qty 22

## 2011-11-18 MED ORDER — FENTANYL CITRATE 0.05 MG/ML IJ SOLN
INTRAMUSCULAR | Status: DC | PRN
  Administered 2011-11-18 (×3): 50 ug via INTRAVENOUS

## 2011-11-18 MED ORDER — LACTATED RINGERS IV SOLN
INTRAVENOUS | Status: DC | PRN
  Administered 2011-11-18: 20:00:00 via INTRAVENOUS

## 2011-11-18 MED ORDER — LACTATED RINGERS IV SOLN
INTRAVENOUS | Status: DC
  Administered 2011-11-18: 22:00:00 via INTRAVENOUS

## 2011-11-18 MED ORDER — CIPROFLOXACIN HCL 500 MG PO TABS: 500.0000 mg | ORAL_TABLET | Freq: Two times a day (BID) | ORAL | Status: AC

## 2011-11-18 MED ORDER — PROPOFOL 10 MG/ML IV EMUL
INTRAVENOUS | Status: DC | PRN
  Administered 2011-11-18: 200 mg via INTRAVENOUS

## 2011-11-18 MED ORDER — FENTANYL CITRATE 0.05 MG/ML IJ SOLN
25.0000 ug | INTRAMUSCULAR | Status: DC | PRN
  Administered 2011-11-18: 50 ug via INTRAVENOUS
  Administered 2011-11-18 (×2): 25 ug via INTRAVENOUS

## 2011-11-18 MED ORDER — FENTANYL CITRATE 0.05 MG/ML IJ SOLN
INTRAMUSCULAR | Status: AC
  Filled 2011-11-18: qty 2

## 2011-11-18 MED ORDER — ONDANSETRON HCL 4 MG/2ML IJ SOLN
INTRAMUSCULAR | Status: DC | PRN
  Administered 2011-11-18: 4 mg via INTRAVENOUS

## 2011-11-18 MED ORDER — MIDAZOLAM HCL 5 MG/5ML IJ SOLN
INTRAMUSCULAR | Status: DC | PRN
  Administered 2011-11-18: 2 mg via INTRAVENOUS

## 2011-11-18 MED ORDER — CIPROFLOXACIN IN D5W 400 MG/200ML IV SOLN
400.0000 mg | INTRAVENOUS | Status: AC
  Administered 2011-11-18: 400 mg via INTRAVENOUS

## 2011-11-18 MED ORDER — OXYCODONE-ACETAMINOPHEN 5-325 MG PO TABS
ORAL_TABLET | ORAL | Status: AC
  Filled 2011-11-18: qty 1

## 2011-11-18 MED ORDER — CIPROFLOXACIN IN D5W 400 MG/200ML IV SOLN
INTRAVENOUS | Status: AC
  Filled 2011-11-18: qty 200

## 2011-11-18 MED ORDER — OXYCODONE-ACETAMINOPHEN 5-325 MG PO TABS
1.0000 | ORAL_TABLET | Freq: Once | ORAL | Status: AC
  Administered 2011-11-18: 1 via ORAL

## 2011-11-18 MED ORDER — BELLADONNA ALKALOIDS-OPIUM 16.2-60 MG RE SUPP
RECTAL | Status: AC
  Filled 2011-11-18: qty 1

## 2011-11-18 MED ORDER — IOHEXOL 300 MG/ML  SOLN
INTRAMUSCULAR | Status: DC | PRN
  Administered 2011-11-18: 10 mL

## 2011-11-18 SURGICAL SUPPLY — 18 items
ADAPTER CATH URET PLST 4-6FR (CATHETERS) ×2 IMPLANT
ADPR CATH URET STRL DISP 4-6FR (CATHETERS) ×1
BAG URO CATCHER STRL LF (DRAPE) ×2 IMPLANT
BASKET ZERO TIP NITINOL 2.4FR (BASKET) ×1 IMPLANT
BSKT STON RTRVL ZERO TP 2.4FR (BASKET) ×1
CATH INTERMIT  6FR 70CM (CATHETERS) IMPLANT
CLOTH BEACON ORANGE TIMEOUT ST (SAFETY) ×2 IMPLANT
DRAPE CAMERA CLOSED 9X96 (DRAPES) ×2 IMPLANT
GLOVE BIOGEL M STRL SZ7.5 (GLOVE) ×2 IMPLANT
GOWN PREVENTION PLUS XLARGE (GOWN DISPOSABLE) ×2 IMPLANT
GOWN STRL NON-REIN LRG LVL3 (GOWN DISPOSABLE) ×4 IMPLANT
GUIDEWIRE ANG ZIPWIRE 038X150 (WIRE) IMPLANT
GUIDEWIRE STR DUAL SENSOR (WIRE) ×2 IMPLANT
LASER FIBER DISP (UROLOGICAL SUPPLIES) ×1 IMPLANT
MANIFOLD NEPTUNE II (INSTRUMENTS) ×2 IMPLANT
PACK CYSTO (CUSTOM PROCEDURE TRAY) ×2 IMPLANT
STENT CONTOUR 6FRX24X.038 (STENTS) ×1 IMPLANT
TUBING CONNECTING 10 (TUBING) ×2 IMPLANT

## 2011-11-18 NOTE — Transfer of Care (Signed)
Immediate Anesthesia Transfer of Care Note  Patient: Stuart Moore  Procedure(s) Performed: Procedure(s) (LRB): CYSTOSCOPY WITH RETROGRADE PYELOGRAM, URETEROSCOPY AND STENT PLACEMENT (Left) HOLMIUM LASER APPLICATION (Left)  Patient Location: PACU  Anesthesia Type: General  Level of Consciousness: awake, alert , oriented and patient cooperative  Airway & Oxygen Therapy: Patient Spontanous Breathing and Patient connected to face mask oxygen  Post-op Assessment: Report given to PACU RN, Post -op Vital signs reviewed and stable and Patient moving all extremities  Post vital signs: Reviewed and stable  Complications: No apparent anesthesia complications

## 2011-11-18 NOTE — Op Note (Signed)
Preoperative diagnosis: Left ureteral calculus  Postoperative diagnosis: Left ureteral calculus  Procedure:  1. Cystoscopy 2. Left ureteroscopy and stone removal 3. Ureteroscopic laser lithotripsy 4. Left ureteral stent placement (6 x 24)  5. Left retrograde pyelography with interpretation  Surgeon: Moody Bruins. M.D.  Anesthesia: General  Complications: None  Intraoperative findings: Left retrograde pyelography demonstrated a filling defect within the left ureter consistent with the patient's known calculus without other abnormalities.  EBL: Minimal  Specimens: 1. Left ureteral calculus  Disposition of specimens: [facility] for stone analysis  Indication: Stuart Moore is a 56 y.o. year old patient with urolithiasis. After reviewing the management options for treatment, the patient elected to proceed with the above surgical procedure(s). We have discussed the potential benefits and risks of the procedure, side effects of the proposed treatment, the likelihood of the patient achieving the goals of the procedure, and any potential problems that might occur during the procedure or recuperation. Informed consent has been obtained.  Description of procedure:  The patient was taken to the operating room and general anesthesia was induced.  The patient was placed in the dorsal lithotomy position, prepped and draped in the usual sterile fashion, and preoperative antibiotics were administered. A preoperative time-out was performed.   Cystourethroscopy was performed.  The patient's urethra was examined and was normal. The bladder was then systematically examined in its entirety. There was no evidence for any bladder tumors, stones, or other mucosal pathology.    Attention then turned to the left ureteral orifice and a ureteral catheter was used to intubate the ureteral orifice.  Omnipaque contrast was injected through the ureteral catheter and a retrograde  pyelogram was performed with findings as dictated above.  A 0.38 sensor guidewire was then advanced up the left ureter into the renal pelvis under fluoroscopic guidance. The 6 Fr semirigid ureteroscope was then advanced into the ureter next to the guidewire and the calculus was identified.   The stone was then fragmented with the 365 micron holmium laser fiber on a setting of 0.6 J and frequency of 8 Hz.   All stones were then removed from the ureter with a zero tip nitinol basket.  Reinspection of the ureter revealed no remaining visible stones or fragments.   The wire was then backloaded through the cystoscope and a ureteral stent was advance over the wire using Seldinger technique.  The stent was positioned appropriately under fluoroscopic and cystoscopic guidance.  The wire was then removed with an adequate stent curl noted in the renal pelvis as well as in the bladder.  The bladder was then emptied and the procedure ended.  The patient appeared to tolerate the procedure well and without complications.  The patient was able to be awakened and transferred to the recovery unit in satisfactory condition.

## 2011-11-18 NOTE — Anesthesia Postprocedure Evaluation (Signed)
Anesthesia Post Note  Patient: Stuart Moore  Procedure(s) Performed: Procedure(s) (LRB): CYSTOSCOPY WITH RETROGRADE PYELOGRAM, URETEROSCOPY AND STENT PLACEMENT (Left) HOLMIUM LASER APPLICATION (Left)  Anesthesia type: General  Patient location: PACU  Post pain: Pain level controlled  Post assessment: Post-op Vital signs reviewed  Last Vitals:  Filed Vitals:   11/18/11 2107  BP:   Pulse:   Temp: 36.7 C  Resp:     Post vital signs: Reviewed  Level of consciousness: sedated  Complications: No apparent anesthesia complications

## 2011-11-18 NOTE — Preoperative (Signed)
Beta Blockers   Reason not to administer Beta Blockers:Not Applicable 

## 2011-11-18 NOTE — Progress Notes (Signed)
Pt notified that Dr Laverle Patter is doing emergency case at [facility]. He verbalizes understanding.

## 2011-11-18 NOTE — Discharge Instructions (Signed)
1. You may see some blood in the urine and may have some burning with urination for 48-72 hours. You also may notice that you have to urinate more frequently or urgently after your procedure which is normal.  2. You should call should you develop an inability urinate, fever > 101, persistent nausea and vomiting that prevents you from eating or drinking to stay hydrated.  3. If you have a stent, you will likely urinate more frequently and urgently until the stent is removed and you may experience some discomfort/pain in the lower abdomen and flank especially when urinating. You may take pain medication prescribed to you if needed for pain. You may also intermittently have blood in the urine until the stent is removed. 4. Take your Percocet as needed.       5.   Remove your stent on Saturday afternoon by pulling on string in penis.  You may experience some pain a few hours after the stent is removed and you should take your pain medication as needed if this occurs.

## 2011-11-18 NOTE — Anesthesia Preprocedure Evaluation (Addendum)
Anesthesia Evaluation  Patient identified by MRN, date of birth, ID band Patient awake    Reviewed: Allergy & Precautions, H&P , NPO status , Patient's Chart, lab work & pertinent test results  History of Anesthesia Complications (+) AWARENESS UNDER ANESTHESIANegative for: history of anesthetic complications  Airway Mallampati: II TM Distance: >3 FB Neck ROM: Full    Dental  (+) Edentulous Upper and Edentulous Lower   Pulmonary neg pulmonary ROS, sleep apnea ,  breath sounds clear to auscultation  Pulmonary exam normal       Cardiovascular hypertension, Pt. on medications negative cardio ROS  + Valvular Problems/Murmurs Rhythm:Regular Rate:Normal     Neuro/Psych  Headaches, PSYCHIATRIC DISORDERS Depression Blind in right eye negative neurological ROS  negative psych ROS   GI/Hepatic negative GI ROS, Neg liver ROS, GERD-  ,(+)     substance abuse (In remote past; denies drug use past 13 years.)  alcohol use and cocaine use, Hepatitis -, C  Endo/Other  negative endocrine ROSDiabetes mellitus-, Type 2, Insulin Dependent  Renal/GU negative Renal ROS  negative genitourinary   Musculoskeletal negative musculoskeletal ROS (+)   Abdominal   Peds  Hematology negative hematology ROS (+)   Anesthesia Other Findings   Reproductive/Obstetrics negative OB ROS                          Anesthesia Physical Anesthesia Plan  ASA: III  Anesthesia Plan: General   Post-op Pain Management:    Induction: Intravenous  Airway Management Planned: LMA  Additional Equipment:   Intra-op Plan:   Post-operative Plan: Extubation in OR  Informed Consent: I have reviewed the patients History and Physical, chart, labs and discussed the procedure including the risks, benefits and alternatives for the proposed anesthesia with the patient or authorized representative who has indicated his/her understanding and  acceptance.   Dental advisory given  Plan Discussed with: CRNA  Anesthesia Plan Comments:         Anesthesia Quick Evaluation

## 2011-11-18 NOTE — H&P (Signed)
Chief Complaint  Kidney stone   History of Present Illness    Stuart Moore is a 56 year old patient of Dr. Vernie Moore with a history urolithiasis. He is worked in today acutely while I am on call. He has undergone shockwave lithotripsy in the past by Dr. Vernie Moore. He presents today with 24 hours of severe left-sided flank pain which caused him to present to the emergency department last night. A CT scan was performed which demonstrated a 5 mm left mid ureteral stone. He also bilateral nonobstructing renal calculi. He denies any fever but has had nausea and vomiting. His pain is currently controlled only mildly with Percocet.   Past Medical History Problems  1. History of  Anxiety (Symptom) 300.00 2. History of  Arthritis V13.4 3. History of  Chronic Liver Disease 571.9 4. History of  Depression 311 5. History of  Diabetes Mellitus 250.00 6. History of  Esophageal Reflux 530.81 7. History of  Hepatitis, C Virus 070.70 8. History of  Hypertension 401.9  Surgical History Problems  1. History of  Ankle Repair Right 2. History of  Leg Repair Right  Current Meds 1. Lisinopril-Hydrochlorothiazide 10-12.5 MG Oral Tablet; Therapy: (Recorded:27Mar2013) to 2. Ondansetron HCl 4 MG Oral Tablet; Therapy: 27Mar2013 to 3. OxyCODONE HCl 5 MG Oral Tablet; Therapy: 26Mar2013 to 4. Oxycodone-Acetaminophen 5-325 MG Oral Tablet; Therapy: 27Mar2013 to 5. Zolpidem Tartrate 10 MG Oral Tablet; Therapy: (Recorded:27Mar2013) to  Allergies Medication  1. No Known Drug Allergies  Family History Problems  1. Family history of  Cancer  Social History Problems    Caffeine Use 1 serving daily   Former Smoker V15.82 smoked 2 packs a week for 30 years; quit smoking in 2008   Marital History - Currently Married Denied    History of  Alcohol Use  Review of Systems Constitutional, skin, eye, otolaryngeal, hematologic/lymphatic, cardiovascular, pulmonary, endocrine, musculoskeletal, gastrointestinal,  neurological and psychiatric system(s) were reviewed and pertinent findings if present are noted.  Gastrointestinal: nausea and vomiting.  Constitutional: fever.  Cardiovascular: no chest pain and no leg swelling.  Respiratory: no shortness of breath.    Vitals Vital Signs [Data Includes: Last 1 Day]  27Mar2013 12:20PM  BMI Calculated: 27.31 BSA Calculated: 1.9 Height: 5 ft 7 in Weight: 174 lb  Blood Pressure: 140 / 89 Temperature: 97.7 F Heart Rate: 62  Physical Exam Constitutional: Well nourished and well developed . No acute distress.  ENT:. The ears and nose are normal in appearance.  Neck: The appearance of the neck is normal and no neck mass is present.  Pulmonary: No respiratory distress, normal respiratory rhythm and effort and clear bilateral breath sounds.  Cardiovascular: Heart rate and rhythm are normal . No peripheral edema.  Abdomen: moderate left CVA tenderness.  Skin: Normal skin turgor, no visible rash and no visible skin lesions.  Neuro/Psych:. Mood and affect are appropriate.    Results/Data Urine [Data Includes: Last 1 Day]   27Mar2013  COLOR YELLOW   APPEARANCE CLOUDY   SPECIFIC GRAVITY 1.010   pH 6.0   GLUCOSE NEG mg/dL  BILIRUBIN NEG   KETONE NEG mg/dL  BLOOD LARGE   PROTEIN NEG mg/dL  UROBILINOGEN 1 mg/dL  NITRITE NEG   LEUKOCYTE ESTERASE NEG   SQUAMOUS EPITHELIAL/HPF NONE SEEN   WBC NONE SEEN WBC/hpf  RBC 21-50 RBC/hpf  BACTERIA NONE SEEN   CRYSTALS NONE SEEN   CASTS NONE SEEN     I independently reviewed the CT scan of the abdomen and pelvis with findings dictated  above. I also reviewed his KUB x-ray today. A stone is not well visualized likely due to the fact that it is overlying the pelvic bone. His renal function was noted to be normal in the emergency room with a serum creatinine of 0.99. Urinalysis demonstrated 0-2 white blood cells and 3-6 red blood cells.   Assessment Assessed  1. Nephrolithiasis 592.0 2. Ureteral Stone  592.1  Plan Health Maintenance (V70.0)  1. UA With REFLEX  Done: 27Mar2013 11:46AM Nephrolithiasis (592.0)  2. KUB  Done: 27Mar2013 12:00AM Ureteral Stone (592.1)  3. Follow-up Schedule Surgery Office  Follow-up  Done: 27Mar2013  Discussion/Summary  1. Obstructing left ureteral calculus: We discussed options which would include medical expulsion therapy versus definitive surgical intervention. He does not feel that his pain is very well-controlled and has concerns about being able to get back to work soon. He therefore would like to proceed with intervention. Due to the fact the stone is not well visualized, his best treatment option would appear to be ureteroscopic laser lithotripsy. We have reviewed the potential risks and complications of this procedure as well as alternative options and expected recovery process. Isn't informed consent to proceed with cystoscopy, left retrograde pyelography, left ureteroscopic laser lithotripsy, and left ureteral stent placement. This will be performed later on this evening.  Cc: Dr. Ihor Gully     Signatures Electronically signed by : Heloise Purpura, M.D.; Nov 18 2011  6:11PM

## 2012-05-12 ENCOUNTER — Encounter: Payer: Self-pay | Admitting: Gastroenterology

## 2013-08-18 ENCOUNTER — Encounter (HOSPITAL_COMMUNITY): Payer: Self-pay | Admitting: Emergency Medicine

## 2013-08-18 ENCOUNTER — Emergency Department (HOSPITAL_COMMUNITY)
Admission: EM | Admit: 2013-08-18 | Discharge: 2013-08-18 | Disposition: A | Discharge status: Home / Self Care | Payer: No Typology Code available for payment source | Attending: Emergency Medicine | Admitting: Emergency Medicine

## 2013-08-18 DIAGNOSIS — Z87891 Personal history of nicotine dependence: Secondary | ICD-10-CM | POA: Insufficient documentation

## 2013-08-18 DIAGNOSIS — I10 Essential (primary) hypertension: Secondary | ICD-10-CM | POA: Insufficient documentation

## 2013-08-18 DIAGNOSIS — Z794 Long term (current) use of insulin: Secondary | ICD-10-CM | POA: Insufficient documentation

## 2013-08-18 DIAGNOSIS — Z79899 Other long term (current) drug therapy: Secondary | ICD-10-CM | POA: Insufficient documentation

## 2013-08-18 DIAGNOSIS — Z8619 Personal history of other infectious and parasitic diseases: Secondary | ICD-10-CM | POA: Insufficient documentation

## 2013-08-18 DIAGNOSIS — R209 Unspecified disturbances of skin sensation: Secondary | ICD-10-CM | POA: Insufficient documentation

## 2013-08-18 DIAGNOSIS — Z8719 Personal history of other diseases of the digestive system: Secondary | ICD-10-CM | POA: Insufficient documentation

## 2013-08-18 DIAGNOSIS — Z9889 Other specified postprocedural states: Secondary | ICD-10-CM | POA: Insufficient documentation

## 2013-08-18 DIAGNOSIS — M5430 Sciatica, unspecified side: Secondary | ICD-10-CM | POA: Insufficient documentation

## 2013-08-18 DIAGNOSIS — H5440 Blindness, one eye, unspecified eye: Secondary | ICD-10-CM | POA: Insufficient documentation

## 2013-08-18 DIAGNOSIS — M5432 Sciatica, left side: Secondary | ICD-10-CM

## 2013-08-18 DIAGNOSIS — E119 Type 2 diabetes mellitus without complications: Secondary | ICD-10-CM | POA: Insufficient documentation

## 2013-08-18 DIAGNOSIS — Z87828 Personal history of other (healed) physical injury and trauma: Secondary | ICD-10-CM | POA: Insufficient documentation

## 2013-08-18 MED ORDER — IBUPROFEN 200 MG PO TABS
600.0000 mg | ORAL_TABLET | Freq: Once | ORAL | Status: AC
  Administered 2013-08-18: 600 mg via ORAL
  Filled 2013-08-18: qty 3

## 2013-08-18 MED ORDER — CYCLOBENZAPRINE HCL 10 MG PO TABS
5.0000 mg | ORAL_TABLET | Freq: Once | ORAL | Status: AC
  Administered 2013-08-18: 5 mg via ORAL
  Filled 2013-08-18: qty 1

## 2013-08-18 MED ORDER — IBUPROFEN 600 MG PO TABS: 600.0000 mg | ORAL_TABLET | Freq: Four times a day (QID) | ORAL | Status: DC | PRN

## 2013-08-18 MED ORDER — OXYCODONE HCL 5 MG PO TABS: 5.0000 mg | ORAL_TABLET | Freq: Four times a day (QID) | ORAL | Status: DC | PRN

## 2013-08-18 MED ORDER — CYCLOBENZAPRINE HCL 10 MG PO TABS: 10.0000 mg | ORAL_TABLET | Freq: Two times a day (BID) | ORAL | Status: DC | PRN

## 2013-08-18 NOTE — ED Notes (Signed)
Pt sts that he has pain L hip pain that "shoots" down his L leg. Pain increases when waling. Full ROM noted. No trauma noted.

## 2013-08-18 NOTE — Progress Notes (Signed)
   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 [place] Has an appt in January 2015 [place] services also at [place] [facility] [place] [facility]     Subjective/Objective Assessment Detail:     Action/Plan:   Action/Plan Detail:   EPIC updated   Anticipated DC Date:  08/18/2013     Status Recommendation to Physician:   Result of Recommendation:    Other ED Services  Consult Working Plan    DC Planning Services  Other  Outpatient Services - Pt will follow up  PCP issues    Choice offered to / List presented to:            Status of service:  Completed, signed off  ED Comments:   ED Comments Detail:
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
## 2013-08-29 ENCOUNTER — Emergency Department (HOSPITAL_COMMUNITY)
Admission: EM | Admit: 2013-08-29 | Discharge: 2013-08-29 | Disposition: A | Discharge status: Home / Self Care | Payer: No Typology Code available for payment source | Attending: Emergency Medicine | Admitting: Emergency Medicine

## 2013-08-29 ENCOUNTER — Encounter (HOSPITAL_COMMUNITY): Payer: Self-pay | Admitting: Emergency Medicine

## 2013-08-29 DIAGNOSIS — Z79899 Other long term (current) drug therapy: Secondary | ICD-10-CM | POA: Insufficient documentation

## 2013-08-29 DIAGNOSIS — Z794 Long term (current) use of insulin: Secondary | ICD-10-CM | POA: Insufficient documentation

## 2013-08-29 DIAGNOSIS — H5440 Blindness, one eye, unspecified eye: Secondary | ICD-10-CM | POA: Insufficient documentation

## 2013-08-29 DIAGNOSIS — R209 Unspecified disturbances of skin sensation: Secondary | ICD-10-CM | POA: Insufficient documentation

## 2013-08-29 DIAGNOSIS — Z87891 Personal history of nicotine dependence: Secondary | ICD-10-CM | POA: Insufficient documentation

## 2013-08-29 DIAGNOSIS — I10 Essential (primary) hypertension: Secondary | ICD-10-CM | POA: Insufficient documentation

## 2013-08-29 DIAGNOSIS — Z9889 Other specified postprocedural states: Secondary | ICD-10-CM | POA: Insufficient documentation

## 2013-08-29 DIAGNOSIS — Z791 Long term (current) use of non-steroidal anti-inflammatories (NSAID): Secondary | ICD-10-CM | POA: Insufficient documentation

## 2013-08-29 DIAGNOSIS — M545 Low back pain, unspecified: Secondary | ICD-10-CM | POA: Insufficient documentation

## 2013-08-29 DIAGNOSIS — M25569 Pain in unspecified knee: Secondary | ICD-10-CM | POA: Insufficient documentation

## 2013-08-29 DIAGNOSIS — K7460 Unspecified cirrhosis of liver: Secondary | ICD-10-CM | POA: Insufficient documentation

## 2013-08-29 DIAGNOSIS — M79609 Pain in unspecified limb: Secondary | ICD-10-CM | POA: Insufficient documentation

## 2013-08-29 DIAGNOSIS — E119 Type 2 diabetes mellitus without complications: Secondary | ICD-10-CM | POA: Insufficient documentation

## 2013-08-29 MED ORDER — METHOCARBAMOL 500 MG PO TABS: 500.0000 mg | ORAL_TABLET | Freq: Two times a day (BID) | ORAL | Status: DC

## 2013-08-29 MED ORDER — OXYCODONE-ACETAMINOPHEN 5-325 MG PO TABS: 1.0000 | ORAL_TABLET | Freq: Four times a day (QID) | ORAL | Status: DC | PRN

## 2013-08-29 NOTE — ED Provider Notes (Signed)
Medical screening examination/treatment/procedure(s) were performed by non-physician practitioner and as supervising physician I was immediately available for consultation/collaboration.  Richarda Blade, MD 08/29/13 1736
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
## 2013-08-29 NOTE — ED Notes (Signed)
Pt is here with a couple weeks of left knee pain into left leg and lower left back.  Pt states [facility] told by [facility] that it was related to sciatica-given meds and no improvement

## 2013-08-29 NOTE — ED Notes (Signed)
Heather, PA at bedside for evaluation. 

## 2014-01-18 ENCOUNTER — Emergency Department (HOSPITAL_COMMUNITY)
Admission: EM | Admit: 2014-01-18 | Discharge: 2014-01-18 | Disposition: A | Discharge status: Home / Self Care | Payer: Non-veteran care | Attending: Emergency Medicine | Admitting: Emergency Medicine

## 2014-01-18 ENCOUNTER — Emergency Department (HOSPITAL_COMMUNITY): Payer: Non-veteran care

## 2014-01-18 ENCOUNTER — Encounter (HOSPITAL_COMMUNITY): Payer: Self-pay | Admitting: Emergency Medicine

## 2014-01-18 DIAGNOSIS — Z96649 Presence of unspecified artificial hip joint: Secondary | ICD-10-CM | POA: Insufficient documentation

## 2014-01-18 DIAGNOSIS — M25562 Pain in left knee: Secondary | ICD-10-CM

## 2014-01-18 DIAGNOSIS — E119 Type 2 diabetes mellitus without complications: Secondary | ICD-10-CM | POA: Insufficient documentation

## 2014-01-18 DIAGNOSIS — M25559 Pain in unspecified hip: Secondary | ICD-10-CM | POA: Insufficient documentation

## 2014-01-18 DIAGNOSIS — Z794 Long term (current) use of insulin: Secondary | ICD-10-CM | POA: Insufficient documentation

## 2014-01-18 DIAGNOSIS — M25552 Pain in left hip: Secondary | ICD-10-CM

## 2014-01-18 DIAGNOSIS — M1710 Unilateral primary osteoarthritis, unspecified knee: Secondary | ICD-10-CM | POA: Insufficient documentation

## 2014-01-18 DIAGNOSIS — Z8719 Personal history of other diseases of the digestive system: Secondary | ICD-10-CM | POA: Insufficient documentation

## 2014-01-18 DIAGNOSIS — IMO0002 Reserved for concepts with insufficient information to code with codable children: Secondary | ICD-10-CM

## 2014-01-18 DIAGNOSIS — M1990 Unspecified osteoarthritis, unspecified site: Secondary | ICD-10-CM

## 2014-01-18 DIAGNOSIS — I10 Essential (primary) hypertension: Secondary | ICD-10-CM | POA: Insufficient documentation

## 2014-01-18 DIAGNOSIS — Z87891 Personal history of nicotine dependence: Secondary | ICD-10-CM | POA: Insufficient documentation

## 2014-01-18 DIAGNOSIS — M25569 Pain in unspecified knee: Secondary | ICD-10-CM | POA: Insufficient documentation

## 2014-01-18 DIAGNOSIS — Z96659 Presence of unspecified artificial knee joint: Secondary | ICD-10-CM | POA: Insufficient documentation

## 2014-01-18 DIAGNOSIS — Z79899 Other long term (current) drug therapy: Secondary | ICD-10-CM | POA: Insufficient documentation

## 2014-01-18 MED ORDER — OXYCODONE-ACETAMINOPHEN 5-325 MG PO TABS: 1.0000 | ORAL_TABLET | Freq: Three times a day (TID) | ORAL | Status: DC | PRN

## 2014-01-18 MED ORDER — IBUPROFEN 400 MG PO TABS: 400.0000 mg | ORAL_TABLET | Freq: Four times a day (QID) | ORAL | Status: DC | PRN

## 2014-01-18 NOTE — ED Notes (Signed)
Patient states he has chronic pain in R hip, leg, knee.   Patient states he has used a cane since 2000.   Patient states he started having pain in L hip and leg about 2 months ago.  Patient states no injury, but has horrible pain and didn't sleep last night.

## 2014-01-18 NOTE — Discharge Instructions (Signed)
Please call your doctor for a followup appointment within 24-48 hours. When you talk to your doctor please let them know that you were seen in the emergency department and have them acquire all of your records so that they can discuss the findings with you and formulate a treatment plan to fully care for your new and ongoing problems. Please call and set up appointment with orthopedics regarding ongoing back pain, left hip pain, left knee pain Please try to evenly distribute the weight on either side Please continue to take at home medications as prescribed Please avoid any physical or shortness activity Please continue to monitor symptoms closely if symptoms are to worsen or change (fever greater than 101, chills, chest pain, shortness of breath, difficulty breathing, numbness, tingling, worsening or changes to pain pattern, inability to control urine or bowel movements, loss of sensation, swelling to the joints, hotness to the joint) please report back to the ED immediately    Arthritis, Nonspecific Arthritis is inflammation of a joint. This usually means pain, redness, warmth or swelling are present. One or more joints may be involved. There are a number of types of arthritis. Your caregiver may not be able to tell what type of arthritis you have right away. CAUSES  The most common cause of arthritis is the wear and tear on the joint (osteoarthritis). This causes damage to the cartilage, which can break down over time. The knees, hips, back and neck are most often affected by this type of arthritis. Other types of arthritis and common causes of joint pain include:  Sprains and other injuries near the joint. Sometimes minor sprains and injuries cause pain and swelling that develop hours later.  Rheumatoid arthritis. This affects hands, feet and knees. It usually affects both sides of your body at the same time. It is often associated with chronic ailments, fever, weight loss and general  weakness.  Crystal arthritis. Gout and pseudo gout can cause occasional acute severe pain, redness and swelling in the foot, ankle, or knee.  Infectious arthritis. Bacteria can get into a joint through a break in overlying skin. This can cause infection of the joint. Bacteria and viruses can also spread through the blood and affect your joints.  Drug, infectious and allergy reactions. Sometimes joints can become mildly painful and slightly swollen with these types of illnesses. SYMPTOMS   Pain is the main symptom.  Your joint or joints can also be red, swollen and warm or hot to the touch.  You may have a fever with certain types of arthritis, or even feel overall ill.  The joint with arthritis will hurt with movement. Stiffness is present with some types of arthritis. DIAGNOSIS  Your caregiver will suspect arthritis based on your description of your symptoms and on your exam. Testing may be needed to find the type of arthritis:  Blood and sometimes urine tests.  X-ray tests and sometimes CT or MRI scans.  Removal of fluid from the joint (arthrocentesis) is done to check for bacteria, crystals or other causes. Your caregiver (or a specialist) will numb the area over the joint with a local anesthetic, and use a needle to remove joint fluid for examination. This procedure is only minimally uncomfortable.  Even with these tests, your caregiver may not be able to tell what kind of arthritis you have. Consultation with a specialist (rheumatologist) may be helpful. TREATMENT  Your caregiver will discuss with you treatment specific to your type of arthritis. If the specific type cannot  be determined, then the following general recommendations may apply. Treatment of severe joint pain includes:  Rest.  Elevation.  Anti-inflammatory medication (for example, ibuprofen) may be prescribed. Avoiding activities that cause increased pain.  Only take over-the-counter or prescription medicines for  pain and discomfort as recommended by your caregiver.  Cold packs over an inflamed joint may be used for 10 to 15 minutes every hour. Hot packs sometimes feel better, but do not use overnight. Do not use hot packs if you are diabetic without your caregiver's permission.  A cortisone shot into arthritic joints may help reduce pain and swelling.  Any acute arthritis that gets worse over the next 1 to 2 days needs to be looked at to be sure there is no joint infection. Long-term arthritis treatment involves modifying activities and lifestyle to reduce joint stress jarring. This can include weight loss. Also, exercise is needed to nourish the joint cartilage and remove waste. This helps keep the muscles around the joint strong. HOME CARE INSTRUCTIONS   Do not take aspirin to relieve pain if gout is suspected. This elevates uric acid levels.  Only take over-the-counter or prescription medicines for pain, discomfort or fever as directed by your caregiver.  Rest the joint as much as possible.  If your joint is swollen, keep it elevated.  Use crutches if the painful joint is in your leg.  Drinking plenty of fluids may help for certain types of arthritis.  Follow your caregiver's dietary instructions.  Try low-impact exercise such as:  Swimming.  Water aerobics.  Biking.  Walking.  Morning stiffness is often relieved by a warm shower.  Put your joints through regular range-of-motion. SEEK MEDICAL CARE IF:   You do not feel better in 24 hours or are getting worse.  You have side effects to medications, or are not getting better with treatment. SEEK IMMEDIATE MEDICAL CARE IF:   You have a fever.  You develop severe joint pain, swelling or redness.  Many joints are involved and become painful and swollen.  There is severe back pain and/or leg weakness.  You have loss of bowel or bladder control. Document Released: 09/17/2004 Document Revised: 11/02/2011 Document Reviewed:  10/03/2008 [facility] Patient Information 2014 [place].

## 2014-01-18 NOTE — ED Notes (Signed)
Waiting for discharge instructions. 
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
## 2014-01-19 NOTE — ED Provider Notes (Signed)
Medical screening examination/treatment/procedure(s) were performed by non-physician practitioner and as supervising physician I was immediately available for consultation/collaboration.   Leven Hoel L Rashun Grattan, MD 01/19/14 0749 

## 2014-04-22 ENCOUNTER — Emergency Department (HOSPITAL_COMMUNITY): Payer: Non-veteran care

## 2014-04-22 ENCOUNTER — Encounter (HOSPITAL_COMMUNITY): Payer: Self-pay | Admitting: Emergency Medicine

## 2014-04-22 ENCOUNTER — Emergency Department (HOSPITAL_COMMUNITY)
Admission: EM | Admit: 2014-04-22 | Discharge: 2014-04-22 | Disposition: A | Discharge status: Home / Self Care | Payer: Non-veteran care | Attending: Emergency Medicine | Admitting: Emergency Medicine

## 2014-04-22 DIAGNOSIS — Z87828 Personal history of other (healed) physical injury and trauma: Secondary | ICD-10-CM | POA: Insufficient documentation

## 2014-04-22 DIAGNOSIS — N201 Calculus of ureter: Secondary | ICD-10-CM | POA: Insufficient documentation

## 2014-04-22 DIAGNOSIS — R109 Unspecified abdominal pain: Secondary | ICD-10-CM | POA: Insufficient documentation

## 2014-04-22 DIAGNOSIS — E119 Type 2 diabetes mellitus without complications: Secondary | ICD-10-CM | POA: Insufficient documentation

## 2014-04-22 DIAGNOSIS — Z8505 Personal history of malignant neoplasm of liver: Secondary | ICD-10-CM | POA: Insufficient documentation

## 2014-04-22 DIAGNOSIS — Z8719 Personal history of other diseases of the digestive system: Secondary | ICD-10-CM | POA: Insufficient documentation

## 2014-04-22 DIAGNOSIS — I10 Essential (primary) hypertension: Secondary | ICD-10-CM | POA: Insufficient documentation

## 2014-04-22 DIAGNOSIS — Z79899 Other long term (current) drug therapy: Secondary | ICD-10-CM | POA: Insufficient documentation

## 2014-04-22 DIAGNOSIS — Z794 Long term (current) use of insulin: Secondary | ICD-10-CM | POA: Insufficient documentation

## 2014-04-22 DIAGNOSIS — Z87891 Personal history of nicotine dependence: Secondary | ICD-10-CM | POA: Insufficient documentation

## 2014-04-22 HISTORY — DX: Calculus of kidney: N20.0

## 2014-04-22 HISTORY — DX: Malignant (primary) neoplasm, unspecified: C80.1

## 2014-04-22 LAB — COMPREHENSIVE METABOLIC PANEL
ALBUMIN: 4 g/dL (ref 3.5–5.2)
ALT: 19 U/L (ref 0–53)
AST: 24 U/L (ref 0–37)
Alkaline Phosphatase: 96 U/L (ref 39–117)
Anion gap: 12 (ref 5–15)
BILIRUBIN TOTAL: 0.4 mg/dL (ref 0.3–1.2)
BUN: 8 mg/dL (ref 6–23)
CHLORIDE: 100 meq/L (ref 96–112)
CO2: 28 mEq/L (ref 19–32)
CREATININE: 1.02 mg/dL (ref 0.50–1.35)
Calcium: 9.4 mg/dL (ref 8.4–10.5)
GFR calc Af Amer: >90 mL/min (ref >90)
GFR calc non Af Amer: 79 mL/min — ABNORMAL LOW (ref >90)
Glucose, Bld: 129 mg/dL — ABNORMAL HIGH (ref 70–99)
POTASSIUM: 4.2 meq/L (ref 3.7–5.3)
SODIUM: 140 meq/L (ref 137–147)
Total Protein: 8.3 g/dL (ref 6.0–8.3)

## 2014-04-22 LAB — URINALYSIS, ROUTINE W REFLEX MICROSCOPIC
BILIRUBIN URINE: NEGATIVE
GLUCOSE, UA: NEGATIVE mg/dL
KETONES UR: NEGATIVE mg/dL
Leukocytes, UA: NEGATIVE
Nitrite: NEGATIVE
PH: 7 (ref 5.0–8.0)
Protein, ur: 30 mg/dL — AB
Specific Gravity, Urine: 1.021 (ref 1.005–1.030)
Urobilinogen, UA: 1 mg/dL (ref 0.0–1.0)

## 2014-04-22 LAB — CBC WITH DIFFERENTIAL/PLATELET
BASOS ABS: 0 10*3/uL (ref 0.0–0.1)
Basophils Relative: 0 % (ref 0–1)
EOS PCT: 4 % (ref 0–5)
Eosinophils Absolute: 0.3 10*3/uL (ref 0.0–0.7)
HEMATOCRIT: 41.5 % (ref 39.0–52.0)
Hemoglobin: 13.7 g/dL (ref 13.0–17.0)
LYMPHS ABS: 3.4 10*3/uL (ref 0.7–4.0)
LYMPHS PCT: 36 % (ref 12–46)
MCH: 29.9 pg (ref 26.0–34.0)
MCHC: 33 g/dL (ref 30.0–36.0)
MCV: 90.6 fL (ref 78.0–100.0)
MONO ABS: 0.8 10*3/uL (ref 0.1–1.0)
Monocytes Relative: 9 % (ref 3–12)
Neutro Abs: 4.9 10*3/uL (ref 1.7–7.7)
Neutrophils Relative %: 51 % (ref 43–77)
Platelets: 216 10*3/uL (ref 150–400)
RBC: 4.58 MIL/uL (ref 4.22–5.81)
RDW: 13 % (ref 11.5–15.5)
WBC: 9.5 10*3/uL (ref 4.0–10.5)

## 2014-04-22 LAB — URINE MICROSCOPIC-ADD ON

## 2014-04-22 MED ORDER — HYDROMORPHONE HCL PF 1 MG/ML IJ SOLN
2.0000 mg | Freq: Once | INTRAMUSCULAR | Status: AC
  Administered 2014-04-22: 2 mg via INTRAVENOUS
  Filled 2014-04-22: qty 2

## 2014-04-22 MED ORDER — TAMSULOSIN HCL 0.4 MG PO CAPS: 0.4000 mg | ORAL_CAPSULE | Freq: Every day | ORAL | Status: DC

## 2014-04-22 MED ORDER — MORPHINE SULFATE 4 MG/ML IJ SOLN
6.0000 mg | Freq: Once | INTRAMUSCULAR | Status: AC
  Administered 2014-04-22: 6 mg via INTRAVENOUS

## 2014-04-22 MED ORDER — KETOROLAC TROMETHAMINE 30 MG/ML IJ SOLN
30.0000 mg | Freq: Once | INTRAMUSCULAR | Status: AC
  Administered 2014-04-22: 30 mg via INTRAVENOUS
  Filled 2014-04-22: qty 1

## 2014-04-22 MED ORDER — HYDROCODONE-IBUPROFEN 7.5-200 MG PO TABS: 1.0000 | ORAL_TABLET | Freq: Four times a day (QID) | ORAL | Status: DC | PRN

## 2014-04-22 MED ORDER — MORPHINE SULFATE 4 MG/ML IJ SOLN
4.0000 mg | Freq: Once | INTRAMUSCULAR | Status: DC
  Filled 2014-04-22: qty 1

## 2014-04-22 NOTE — Discharge Instructions (Signed)
Please follow up with your primary care physician in 1-2 days. If you do not have one please call the [place] number listed above. Please follow up with Dr. Karsten Ro to schedule a follow up appointment. Please take pain medication and/or muscle relaxants as prescribed and as needed for pain. Please do not drive on narcotic pain medication or on muscle relaxants. Please read all discharge instructions and return precautions.    Kidney Stones Kidney stones (urolithiasis) are deposits that form inside your kidneys. The intense pain is caused by the stone moving through the urinary tract. When the stone moves, the ureter goes into spasm around the stone. The stone is usually passed in the urine.  CAUSES   A disorder that makes certain neck glands produce too much parathyroid hormone (primary hyperparathyroidism).  A buildup of uric acid crystals, similar to gout in your joints.  Narrowing (stricture) of the ureter.  A kidney obstruction present at birth (congenital obstruction).  Previous surgery on the kidney or ureters.  Numerous kidney infections. SYMPTOMS   Feeling sick to your stomach (nauseous).  Throwing up (vomiting).  Blood in the urine (hematuria).  Pain that usually spreads (radiates) to the groin.  Frequency or urgency of urination. DIAGNOSIS   Taking a history and physical exam.  Blood or urine tests.  CT scan.  Occasionally, an examination of the inside of the urinary bladder (cystoscopy) is performed. TREATMENT   Observation.  Increasing your fluid intake.  Extracorporeal shock wave lithotripsy--This is a noninvasive procedure that uses shock waves to break up kidney stones.  Surgery may be needed if you have severe pain or persistent obstruction. There are various surgical procedures. Most of the procedures are performed with the use of small instruments. Only small incisions are needed to accommodate these instruments, so recovery time  is minimized. The size, location, and chemical composition are all important variables that will determine the proper choice of action for you. Talk to your health care provider to better understand your situation so that you will minimize the risk of injury to yourself and your kidney.  HOME CARE INSTRUCTIONS   Drink enough water and fluids to keep your urine clear or pale yellow. This will help you to pass the stone or stone fragments.  Strain all urine through the provided strainer. Keep all particulate matter and stones for your health care provider to see. The stone causing the pain may be as small as a grain of salt. It is very important to use the strainer each and every time you pass your urine. The collection of your stone will allow your health care provider to analyze it and verify that a stone has actually passed. The stone analysis will often identify what you can do to reduce the incidence of recurrences.  Only take over-the-counter or prescription medicines for pain, discomfort, or fever as directed by your health care provider.  Make a follow-up appointment with your health care provider as directed.  Get follow-up X-rays if required. The absence of pain does not always mean that the stone has passed. It may have only stopped moving. If the urine remains completely obstructed, it can cause loss of kidney function or even complete destruction of the kidney. It is your responsibility to make sure X-rays and follow-ups are completed. Ultrasounds of the kidney can show blockages and the status of the kidney. Ultrasounds are not associated with any radiation and can be performed easily in a matter of minutes. SEEK  MEDICAL CARE IF:  You experience pain that is progressive and unresponsive to any pain medicine you have been prescribed. SEEK IMMEDIATE MEDICAL CARE IF:   Pain cannot be controlled with the prescribed medicine.  You have a fever or shaking chills.  The severity or  intensity of pain increases over 18 hours and is not relieved by pain medicine.  You develop a new onset of abdominal pain.  You feel faint or pass out.  You are unable to urinate. MAKE SURE YOU:   Understand these instructions.  Will watch your condition.  Will get help right away if you are not doing well or get worse. Document Released: 08/10/2005 Document Revised: 04/12/2013 Document Reviewed: 01/11/2013 [facility] Patient Information 2015 [place], [place]. This information is not intended to replace advice given to you by your health care provider. Make sure you discuss any questions you have with your health care provider.

## 2014-04-22 NOTE — ED Provider Notes (Signed)
Medical screening examination/treatment/procedure(s) were conducted as a shared visit with non-physician practitioner(s) or resident and myself. I personally evaluated the patient during the encounter and agree with the findings.  I have personally reviewed any xrays and/ or EKG's with the provider and I agree with interpretation.  patient with liver disease, cirrhosis, liver cancer, kidney stones presents with left flank pain for 2 days. Overall similar to kidney stone the past. Patient has not required fluid drained from his abdomen the past. No fevers. On exam mild left flank pain abdomen soft, mild distention. Bedside ultrasound mild hydronephrosis left kidney. Plan for urinalysis, blood work pain meds and followup outpatient with urology.  Emergency Focused Ultrasound Exam  Limited retroperitoneal ultrasound of kidneys  Performed and interpreted by Dr. Reather Converse  Indication: flank pain  Focused abdominal ultrasound with both kidneys imaged in transverse and longitudinal planes in real-time.  Interpretation: left mild hydronephrosis visualized.  Images archived electronically  Left flank pain, left kidney stone with hydronephrosis mild   Mariea Clonts, MD 04/22/14 1735

## 2014-04-22 NOTE — ED Notes (Signed)
He states hes had L flank and LLQ pain x 2 days. This morning the pain got worse and he vomited. He states hes had pain and difficulty voiding this week.
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
## 2014-04-23 LAB — URINE CULTURE: Colony Count: 5000

## 2014-06-27 ENCOUNTER — Emergency Department (HOSPITAL_COMMUNITY): Payer: Non-veteran care

## 2014-06-27 ENCOUNTER — Encounter (HOSPITAL_COMMUNITY): Payer: Self-pay | Admitting: *Deleted

## 2014-06-27 ENCOUNTER — Inpatient Hospital Stay (HOSPITAL_COMMUNITY)
Admission: EM | Admit: 2014-06-27 | Discharge: 2014-07-02 | DRG: 694 | Disposition: A | Discharge status: Home / Self Care | Payer: Non-veteran care | Attending: Internal Medicine | Admitting: Internal Medicine

## 2014-06-27 ENCOUNTER — Other Ambulatory Visit: Payer: Self-pay

## 2014-06-27 DIAGNOSIS — N200 Calculus of kidney: Secondary | ICD-10-CM | POA: Diagnosis present

## 2014-06-27 DIAGNOSIS — R079 Chest pain, unspecified: Secondary | ICD-10-CM

## 2014-06-27 DIAGNOSIS — R109 Unspecified abdominal pain: Secondary | ICD-10-CM | POA: Diagnosis present

## 2014-06-27 DIAGNOSIS — I10 Essential (primary) hypertension: Secondary | ICD-10-CM | POA: Diagnosis present

## 2014-06-27 DIAGNOSIS — H5441 Blindness, right eye, normal vision left eye: Secondary | ICD-10-CM | POA: Diagnosis present

## 2014-06-27 DIAGNOSIS — G893 Neoplasm related pain (acute) (chronic): Secondary | ICD-10-CM | POA: Diagnosis present

## 2014-06-27 DIAGNOSIS — D72829 Elevated white blood cell count, unspecified: Secondary | ICD-10-CM | POA: Clinically undetermined

## 2014-06-27 DIAGNOSIS — R0602 Shortness of breath: Secondary | ICD-10-CM

## 2014-06-27 DIAGNOSIS — Z87442 Personal history of urinary calculi: Secondary | ICD-10-CM

## 2014-06-27 DIAGNOSIS — Z8505 Personal history of malignant neoplasm of liver: Secondary | ICD-10-CM

## 2014-06-27 DIAGNOSIS — E089 Diabetes mellitus due to underlying condition without complications: Secondary | ICD-10-CM | POA: Insufficient documentation

## 2014-06-27 DIAGNOSIS — R188 Other ascites: Secondary | ICD-10-CM

## 2014-06-27 DIAGNOSIS — K7460 Unspecified cirrhosis of liver: Secondary | ICD-10-CM | POA: Diagnosis present

## 2014-06-27 DIAGNOSIS — E118 Type 2 diabetes mellitus with unspecified complications: Secondary | ICD-10-CM

## 2014-06-27 DIAGNOSIS — C799 Secondary malignant neoplasm of unspecified site: Secondary | ICD-10-CM | POA: Diagnosis present

## 2014-06-27 DIAGNOSIS — R1084 Generalized abdominal pain: Secondary | ICD-10-CM

## 2014-06-27 DIAGNOSIS — R509 Fever, unspecified: Secondary | ICD-10-CM | POA: Diagnosis present

## 2014-06-27 DIAGNOSIS — K219 Gastro-esophageal reflux disease without esophagitis: Secondary | ICD-10-CM | POA: Diagnosis present

## 2014-06-27 DIAGNOSIS — F411 Generalized anxiety disorder: Secondary | ICD-10-CM | POA: Diagnosis present

## 2014-06-27 DIAGNOSIS — N1330 Unspecified hydronephrosis: Secondary | ICD-10-CM | POA: Diagnosis present

## 2014-06-27 DIAGNOSIS — E119 Type 2 diabetes mellitus without complications: Secondary | ICD-10-CM | POA: Diagnosis present

## 2014-06-27 DIAGNOSIS — K5900 Constipation, unspecified: Secondary | ICD-10-CM | POA: Diagnosis not present

## 2014-06-27 DIAGNOSIS — N132 Hydronephrosis with renal and ureteral calculous obstruction: Principal | ICD-10-CM | POA: Diagnosis present

## 2014-06-27 DIAGNOSIS — Z79899 Other long term (current) drug therapy: Secondary | ICD-10-CM

## 2014-06-27 DIAGNOSIS — Z794 Long term (current) use of insulin: Secondary | ICD-10-CM

## 2014-06-27 DIAGNOSIS — Z791 Long term (current) use of non-steroidal anti-inflammatories (NSAID): Secondary | ICD-10-CM

## 2014-06-27 DIAGNOSIS — B1920 Unspecified viral hepatitis C without hepatic coma: Secondary | ICD-10-CM | POA: Diagnosis present

## 2014-06-27 DIAGNOSIS — Z87891 Personal history of nicotine dependence: Secondary | ICD-10-CM

## 2014-06-27 DIAGNOSIS — Z79891 Long term (current) use of opiate analgesic: Secondary | ICD-10-CM

## 2014-06-27 DIAGNOSIS — C229 Malignant neoplasm of liver, not specified as primary or secondary: Secondary | ICD-10-CM

## 2014-06-27 DIAGNOSIS — C220 Liver cell carcinoma: Secondary | ICD-10-CM | POA: Diagnosis present

## 2014-06-27 LAB — PROTIME-INR
INR: 1.07 (ref 0.00–1.49)
PROTHROMBIN TIME: 14.1 s (ref 11.6–15.2)

## 2014-06-27 LAB — URINALYSIS, ROUTINE W REFLEX MICROSCOPIC
Bilirubin Urine: NEGATIVE
Glucose, UA: NEGATIVE mg/dL
Hgb urine dipstick: NEGATIVE
Ketones, ur: NEGATIVE mg/dL
LEUKOCYTES UA: NEGATIVE
NITRITE: NEGATIVE
PROTEIN: NEGATIVE mg/dL
Specific Gravity, Urine: 1.016 (ref 1.005–1.030)
UROBILINOGEN UA: 1 mg/dL (ref 0.0–1.0)
pH: 7.5 (ref 5.0–8.0)

## 2014-06-27 LAB — CBG MONITORING, ED: GLUCOSE-CAPILLARY: 173 mg/dL — AB (ref 70–99)

## 2014-06-27 LAB — COMPREHENSIVE METABOLIC PANEL
ALT: 20 U/L (ref 0–53)
AST: 34 U/L (ref 0–37)
Albumin: 4.1 g/dL (ref 3.5–5.2)
Alkaline Phosphatase: 131 U/L — ABNORMAL HIGH (ref 39–117)
Anion gap: 13 (ref 5–15)
BUN: 11 mg/dL (ref 6–23)
CALCIUM: 9.6 mg/dL (ref 8.4–10.5)
CO2: 28 mEq/L (ref 19–32)
Chloride: 100 mEq/L (ref 96–112)
Creatinine, Ser: 1.02 mg/dL (ref 0.50–1.35)
GFR calc non Af Amer: 79 mL/min — ABNORMAL LOW (ref >90)
Glucose, Bld: 154 mg/dL — ABNORMAL HIGH (ref 70–99)
Potassium: 4.5 mEq/L (ref 3.7–5.3)
SODIUM: 141 meq/L (ref 137–147)
TOTAL PROTEIN: 8.7 g/dL — AB (ref 6.0–8.3)
Total Bilirubin: 0.2 mg/dL — ABNORMAL LOW (ref 0.3–1.2)

## 2014-06-27 LAB — GLUCOSE, CAPILLARY: Glucose-Capillary: 206 mg/dL — ABNORMAL HIGH (ref 70–99)

## 2014-06-27 LAB — TROPONIN I: Troponin I: <0.3 ng/mL (ref <0.30)

## 2014-06-27 LAB — CBC WITH DIFFERENTIAL/PLATELET
BASOS ABS: 0 10*3/uL (ref 0.0–0.1)
Basophils Relative: 0 % (ref 0–1)
EOS ABS: 0.3 10*3/uL (ref 0.0–0.7)
Eosinophils Relative: 3 % (ref 0–5)
HCT: 42.5 % (ref 39.0–52.0)
Hemoglobin: 13.8 g/dL (ref 13.0–17.0)
Lymphocytes Relative: 36 % (ref 12–46)
Lymphs Abs: 4.1 10*3/uL — ABNORMAL HIGH (ref 0.7–4.0)
MCH: 28.4 pg (ref 26.0–34.0)
MCHC: 32.5 g/dL (ref 30.0–36.0)
MCV: 87.4 fL (ref 78.0–100.0)
Monocytes Absolute: 1.2 10*3/uL — ABNORMAL HIGH (ref 0.1–1.0)
Monocytes Relative: 10 % (ref 3–12)
Neutro Abs: 6 10*3/uL (ref 1.7–7.7)
Neutrophils Relative %: 51 % (ref 43–77)
PLATELETS: 289 10*3/uL (ref 150–400)
RBC: 4.86 MIL/uL (ref 4.22–5.81)
RDW: 13.1 % (ref 11.5–15.5)
WBC: 11.6 10*3/uL — ABNORMAL HIGH (ref 4.0–10.5)

## 2014-06-27 LAB — LACTIC ACID, PLASMA: Lactic Acid, Venous: 1.7 mmol/L (ref 0.5–2.2)

## 2014-06-27 LAB — LIPASE, BLOOD: Lipase: 38 U/L (ref 11–59)

## 2014-06-27 MED ORDER — ACETAMINOPHEN 650 MG RE SUPP: 650.0000 mg | Freq: Four times a day (QID) | RECTAL | Status: DC | PRN

## 2014-06-27 MED ORDER — IBUPROFEN 400 MG PO TABS
600.0000 mg | ORAL_TABLET | Freq: Once | ORAL | Status: AC
  Administered 2014-06-27: 600 mg via ORAL
  Filled 2014-06-27 (×2): qty 1

## 2014-06-27 MED ORDER — INSULIN GLARGINE 100 UNIT/ML ~~LOC~~ SOLN
30.0000 [IU] | Freq: Every day | SUBCUTANEOUS | Status: DC
  Administered 2014-06-27 – 2014-06-30 (×4): 30 [IU] via SUBCUTANEOUS
  Filled 2014-06-27 (×6): qty 0.3

## 2014-06-27 MED ORDER — SODIUM CHLORIDE 0.9 % IV SOLN
INTRAVENOUS | Status: DC
  Administered 2014-06-27: 22:00:00 via INTRAVENOUS

## 2014-06-27 MED ORDER — INSULIN ASPART 100 UNIT/ML ~~LOC~~ SOLN
0.0000 [IU] | Freq: Three times a day (TID) | SUBCUTANEOUS | Status: DC
  Administered 2014-06-28 (×2): 2 [IU] via SUBCUTANEOUS
  Administered 2014-06-28 – 2014-06-29 (×2): 3 [IU] via SUBCUTANEOUS
  Administered 2014-06-29: 2 [IU] via SUBCUTANEOUS
  Administered 2014-06-29: 5 [IU] via SUBCUTANEOUS
  Administered 2014-06-30 (×2): 3 [IU] via SUBCUTANEOUS
  Administered 2014-06-30: 2 [IU] via SUBCUTANEOUS
  Administered 2014-07-01: 5 [IU] via SUBCUTANEOUS
  Administered 2014-07-01 (×2): 2 [IU] via SUBCUTANEOUS
  Administered 2014-07-02: 5 [IU] via SUBCUTANEOUS

## 2014-06-27 MED ORDER — IOHEXOL 350 MG/ML SOLN
80.0000 mL | Freq: Once | INTRAVENOUS | Status: AC | PRN
  Administered 2014-06-27: 80 mL via INTRAVENOUS

## 2014-06-27 MED ORDER — PROPRANOLOL HCL 20 MG PO TABS
20.0000 mg | ORAL_TABLET | Freq: Two times a day (BID) | ORAL | Status: DC
  Administered 2014-06-27 – 2014-06-28 (×2): 20 mg via ORAL
  Filled 2014-06-27 (×3): qty 1

## 2014-06-27 MED ORDER — ONDANSETRON HCL 4 MG/2ML IJ SOLN
4.0000 mg | Freq: Once | INTRAMUSCULAR | Status: AC
  Administered 2014-06-27: 4 mg via INTRAVENOUS
  Filled 2014-06-27: qty 2

## 2014-06-27 MED ORDER — PANTOPRAZOLE SODIUM 40 MG PO TBEC
40.0000 mg | DELAYED_RELEASE_TABLET | Freq: Two times a day (BID) | ORAL | Status: DC
  Administered 2014-06-27 – 2014-07-02 (×10): 40 mg via ORAL
  Filled 2014-06-27 (×13): qty 1

## 2014-06-27 MED ORDER — HYDROMORPHONE HCL 1 MG/ML IJ SOLN
1.0000 mg | Freq: Once | INTRAMUSCULAR | Status: AC
  Administered 2014-06-27: 1 mg via INTRAVENOUS
  Filled 2014-06-27: qty 1

## 2014-06-27 MED ORDER — TAMSULOSIN HCL 0.4 MG PO CAPS
0.4000 mg | ORAL_CAPSULE | Freq: Every day | ORAL | Status: DC
  Administered 2014-06-27 – 2014-07-02 (×6): 0.4 mg via ORAL
  Filled 2014-06-27 (×6): qty 1

## 2014-06-27 MED ORDER — ONDANSETRON 4 MG PO TBDP
8.0000 mg | ORAL_TABLET | Freq: Once | ORAL | Status: AC
  Administered 2014-06-27: 8 mg via ORAL
  Filled 2014-06-27: qty 2

## 2014-06-27 MED ORDER — ONDANSETRON HCL 4 MG PO TABS: 4.0000 mg | ORAL_TABLET | Freq: Four times a day (QID) | ORAL | Status: DC | PRN

## 2014-06-27 MED ORDER — ENOXAPARIN SODIUM 40 MG/0.4ML ~~LOC~~ SOLN
40.0000 mg | Freq: Every day | SUBCUTANEOUS | Status: DC
  Administered 2014-06-28 – 2014-07-02 (×5): 40 mg via SUBCUTANEOUS
  Filled 2014-06-27 (×5): qty 0.4

## 2014-06-27 MED ORDER — POLYETHYLENE GLYCOL 3350 17 G PO PACK
17.0000 g | PACK | Freq: Every day | ORAL | Status: DC
Start: 2014-06-28 — End: 2014-07-02
  Administered 2014-06-28 – 2014-07-01 (×4): 17 g via ORAL
  Filled 2014-06-27 (×5): qty 1

## 2014-06-27 MED ORDER — HYDROMORPHONE HCL 1 MG/ML IJ SOLN: 1.0000 mg | INTRAMUSCULAR | Status: DC | PRN

## 2014-06-27 MED ORDER — LISINOPRIL 40 MG PO TABS
40.0000 mg | ORAL_TABLET | Freq: Every day | ORAL | Status: DC
  Administered 2014-06-28: 40 mg via ORAL
  Filled 2014-06-27: qty 1

## 2014-06-27 MED ORDER — ONDANSETRON HCL 4 MG/2ML IJ SOLN: 4.0000 mg | Freq: Four times a day (QID) | INTRAMUSCULAR | Status: DC | PRN

## 2014-06-27 MED ORDER — TRAZODONE HCL 50 MG PO TABS
50.0000 mg | ORAL_TABLET | Freq: Every evening | ORAL | Status: DC | PRN
  Administered 2014-06-27 – 2014-07-01 (×3): 50 mg via ORAL
  Filled 2014-06-27 (×4): qty 1

## 2014-06-27 MED ORDER — DOCUSATE SODIUM 100 MG PO CAPS
100.0000 mg | ORAL_CAPSULE | Freq: Two times a day (BID) | ORAL | Status: DC
  Administered 2014-06-27 – 2014-07-01 (×8): 100 mg via ORAL
  Filled 2014-06-27 (×11): qty 1

## 2014-06-27 MED ORDER — INSULIN ASPART 100 UNIT/ML ~~LOC~~ SOLN
0.0000 [IU] | Freq: Every day | SUBCUTANEOUS | Status: DC
  Administered 2014-06-28 – 2014-07-01 (×3): 2 [IU] via SUBCUTANEOUS

## 2014-06-27 MED ORDER — OXYCODONE HCL 5 MG PO TABS
5.0000 mg | ORAL_TABLET | ORAL | Status: DC | PRN
  Administered 2014-06-27: 10 mg via ORAL
  Filled 2014-06-27: qty 2

## 2014-06-27 MED ORDER — MORPHINE SULFATE 2 MG/ML IJ SOLN
2.0000 mg | INTRAMUSCULAR | Status: DC | PRN
  Administered 2014-06-28 – 2014-06-29 (×6): 4 mg via INTRAVENOUS
  Administered 2014-06-29 – 2014-06-30 (×4): 2 mg via INTRAVENOUS
  Filled 2014-06-27: qty 2
  Filled 2014-06-27 (×2): qty 1
  Filled 2014-06-27 (×2): qty 2
  Filled 2014-06-27 (×2): qty 1
  Filled 2014-06-27 (×3): qty 2

## 2014-06-27 MED ORDER — ACETAMINOPHEN 325 MG PO TABS
650.0000 mg | ORAL_TABLET | Freq: Four times a day (QID) | ORAL | Status: DC | PRN
  Administered 2014-06-28 – 2014-07-01 (×6): 650 mg via ORAL
  Filled 2014-06-27 (×6): qty 2

## 2014-06-27 NOTE — ED Notes (Signed)
Pt's wife came to nurse's station stating that the pt felt like he had a fever.

## 2014-06-27 NOTE — ED Notes (Signed)
[name], PA is aware of the pt's temperature. Orders to follow.

## 2014-06-27 NOTE — ED Notes (Signed)
Patel, MD at bedside.  
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
## 2014-06-27 NOTE — ED Provider Notes (Signed)
Medical screening examination/treatment/procedure(s) were conducted as a shared visit with non-physician practitioner(s) and myself.  I personally evaluated the patient during the encounter.   EKG Interpretation   Date/Time:  Wednesday June 27 2014 16:12:48 EST Ventricular Rate:  91 PR Interval:  208 QRS Duration: 74 QT Interval:  348 QTC Calculation: 428 R Axis:   21 Text Interpretation:  Normal sinus rhythm Normal ECG Confirmed by  Mirko Tailor  MD, Billyjack Trompeter [phone]) on 06/27/2014 8:01:21 PM      Results for orders placed or performed during the hospital encounter of 06/27/14  CBC with Differential  Result Value Ref Range   WBC 11.6 (H) 4.0 - 10.5 K/uL   RBC 4.86 4.22 - 5.81 MIL/uL   Hemoglobin 13.8 13.0 - 17.0 g/dL   HCT 42.5 39.0 - 52.0 %   MCV 87.4 78.0 - 100.0 fL   MCH 28.4 26.0 - 34.0 pg   MCHC 32.5 30.0 - 36.0 g/dL   RDW 13.1 11.5 - 15.5 %   Platelets 289 150 - 400 K/uL   Neutrophils Relative % 51 43 - 77 %   Neutro Abs 6.0 1.7 - 7.7 K/uL   Lymphocytes Relative 36 12 - 46 %   Lymphs Abs 4.1 (H) 0.7 - 4.0 K/uL   Monocytes Relative 10 3 - 12 %   Monocytes Absolute 1.2 (H) 0.1 - 1.0 K/uL   Eosinophils Relative 3 0 - 5 %   Eosinophils Absolute 0.3 0.0 - 0.7 K/uL   Basophils Relative 0 0 - 1 %   Basophils Absolute 0.0 0.0 - 0.1 K/uL  Comprehensive metabolic panel  Result Value Ref Range   Sodium 141 137 - 147 mEq/L   Potassium 4.5 3.7 - 5.3 mEq/L   Chloride 100 96 - 112 mEq/L   CO2 28 19 - 32 mEq/L   Glucose, Bld 154 (H) 70 - 99 mg/dL   BUN 11 6 - 23 mg/dL   Creatinine, Ser 1.02 0.50 - 1.35 mg/dL   Calcium 9.6 8.4 - 10.5 mg/dL   Total Protein 8.7 (H) 6.0 - 8.3 g/dL   Albumin 4.1 3.5 - 5.2 g/dL   AST 34 0 - 37 U/L   ALT 20 0 - 53 U/L   Alkaline Phosphatase 131 (H) 39 - 117 U/L   Total Bilirubin 0.2 (L) 0.3 - 1.2 mg/dL   GFR calc non Af Amer 79 (L) >90 mL/min   GFR calc Af Amer >90 >90 mL/min   Anion gap 13 5 - 15  Lipase, blood  Result Value Ref Range    Lipase 38 11 - 59 U/L  Urinalysis, Routine w reflex microscopic  Result Value Ref Range   Color, Urine YELLOW YELLOW   APPearance CLEAR CLEAR   Specific Gravity, Urine 1.016 1.005 - 1.030   pH 7.5 5.0 - 8.0   Glucose, UA NEGATIVE NEGATIVE mg/dL   Hgb urine dipstick NEGATIVE NEGATIVE   Bilirubin Urine NEGATIVE NEGATIVE   Ketones, ur NEGATIVE NEGATIVE mg/dL   Protein, ur NEGATIVE NEGATIVE mg/dL   Urobilinogen, UA 1.0 0.0 - 1.0 mg/dL   Nitrite NEGATIVE NEGATIVE   Leukocytes, UA NEGATIVE NEGATIVE  Protime-INR  Result Value Ref Range   Prothrombin Time 14.1 11.6 - 15.2 seconds   INR 1.07 0.00 - 1.49  Lactic acid, plasma  Result Value Ref Range   Lactic Acid, Venous 1.7 0.5 - 2.2 mmol/L  Troponin I  Result Value Ref Range   Troponin I <0.30 <0.30 ng/mL   Ct Angio  Chest Pe W/cm &/or Wo Cm  06/27/2014   CLINICAL DATA:  Increased abdominal pain for 3-4 days. History of liver cancer. Patient starting chemotherapy.  EXAM: CT ANGIOGRAPHY CHEST  CT ABDOMEN AND PELVIS WITH CONTRAST  TECHNIQUE: Multidetector CT imaging of the chest was performed using the standard protocol during bolus administration of intravenous contrast. Multiplanar CT image reconstructions and MIPs were obtained to evaluate the vascular anatomy. Multidetector CT imaging of the abdomen and pelvis was performed using the standard protocol during bolus administration of intravenous contrast.  CONTRAST:  30mL OMNIPAQUE IOHEXOL 350 MG/ML SOLN  COMPARISON:  Abdominal CT 04/22/2014  FINDINGS: CTA CHEST FINDINGS  The contrast bolus is not optimal for evaluation of the pulmonary arteries but there is no evidence for large or central pulmonary embolism. The thoracic aorta is well opacified with contrast and there is no evidence for a dissection or aneurysm. The great vessels are patent. Mild dilatation of the esophagus with an air-fluid level. There is no significant chest lymphadenopathy. No significant pericardial or pleural fluid. Patchy  densities in the lower lobes are most compatible with atelectasis. No significant airspace disease. No acute bone abnormality.  CT ABDOMEN and PELVIS FINDINGS  Negative for free intraperitoneal air. There is a poorly defined low-density lesion along the anterior dome of the liver. This area roughly measures 1.9 cm and may represent the known liver malignancy. Liver contour is mildly nodular and suspect underlying cirrhosis. Normal appearance of the gallbladder. Normal appearance of the spleen and adrenal glands. Normal appearance of the pancreas. Again noted are markedly enlarged lymph nodes in the upper abdomen and porta hepatis region. Index lymph node in the precaval space measures 4.0 cm in the AP dimension and previously measured 3.5 cm. Large lymph node posterior to the gastric antrum measures 4.1 cm in AP dimension and previously measured 3.5 cm. Large lymph node in the porta hepatis is low-density and likely necrotic. This node measures 3.1 cm in the short axis and previously measured 2.7 cm. This large porta hepatis node causes some compression of the distal main portal vein but the portal venous system is patent. No significant dilatation of the stomach. Small lymph nodes in the gastrohepatic ligament region. Lymph node between the aorta and IVC measures 2.3 cm in short axis and previously measured 1.9 cm. There are additional retroperitoneal lymph nodes that demonstrate mild enlargement.  There is no significant lymphadenopathy in the pelvis. No evidence for abdominal ascites or free fluid. Normal appearance of the bladder and prostate. Normal appearance of the right kidney without hydronephrosis. Incidentally, the patient has a duplicated right renal collecting system. Again noted is mild fullness of the left renal pelvis. There is a persistent stone in the mid left ureter which measures 6 mm. Limited evaluation for kidney stones due to the post contrast images.  Normal appearance of the small bowel,  large bowel and appendix.  Patient has an intramedullary rod in the proximal right femur. No acute bone abnormality.  Review of the MIP images confirms the above findings.  IMPRESSION: Enlargement of the upper abdominal and retroperitoneal lymphadenopathy. Findings are consistent with metastatic disease and, reportedly, patient has a history of liver cancer. There is a poorly defined lesion near the hepatic dome which could represent a liver neoplasm. There is evidence for cirrhosis.  No evidence for abdominal ascites or free fluid.  Minimal left hydronephrosis with a stable 6 mm stone in the mid left ureter.  Limited evaluation for pulmonary emboli but no evidence  for a large or central pulmonary embolism.  Areas of volume loss in the lungs without focal airspace disease.   Electronically Signed   By: Markus Daft M.D.   On: 06/27/2014 19:45   Ct Abdomen Pelvis W Contrast  06/27/2014   CLINICAL DATA:  Increased abdominal pain for 3-4 days. History of liver cancer. Patient starting chemotherapy.  EXAM: CT ANGIOGRAPHY CHEST  CT ABDOMEN AND PELVIS WITH CONTRAST  TECHNIQUE: Multidetector CT imaging of the chest was performed using the standard protocol during bolus administration of intravenous contrast. Multiplanar CT image reconstructions and MIPs were obtained to evaluate the vascular anatomy. Multidetector CT imaging of the abdomen and pelvis was performed using the standard protocol during bolus administration of intravenous contrast.  CONTRAST:  81mL OMNIPAQUE IOHEXOL 350 MG/ML SOLN  COMPARISON:  Abdominal CT 04/22/2014  FINDINGS: CTA CHEST FINDINGS  The contrast bolus is not optimal for evaluation of the pulmonary arteries but there is no evidence for large or central pulmonary embolism. The thoracic aorta is well opacified with contrast and there is no evidence for a dissection or aneurysm. The great vessels are patent. Mild dilatation of the esophagus with an air-fluid level. There is no significant chest  lymphadenopathy. No significant pericardial or pleural fluid. Patchy densities in the lower lobes are most compatible with atelectasis. No significant airspace disease. No acute bone abnormality.  CT ABDOMEN and PELVIS FINDINGS  Negative for free intraperitoneal air. There is a poorly defined low-density lesion along the anterior dome of the liver. This area roughly measures 1.9 cm and may represent the known liver malignancy. Liver contour is mildly nodular and suspect underlying cirrhosis. Normal appearance of the gallbladder. Normal appearance of the spleen and adrenal glands. Normal appearance of the pancreas. Again noted are markedly enlarged lymph nodes in the upper abdomen and porta hepatis region. Index lymph node in the precaval space measures 4.0 cm in the AP dimension and previously measured 3.5 cm. Large lymph node posterior to the gastric antrum measures 4.1 cm in AP dimension and previously measured 3.5 cm. Large lymph node in the porta hepatis is low-density and likely necrotic. This node measures 3.1 cm in the short axis and previously measured 2.7 cm. This large porta hepatis node causes some compression of the distal main portal vein but the portal venous system is patent. No significant dilatation of the stomach. Small lymph nodes in the gastrohepatic ligament region. Lymph node between the aorta and IVC measures 2.3 cm in short axis and previously measured 1.9 cm. There are additional retroperitoneal lymph nodes that demonstrate mild enlargement.  There is no significant lymphadenopathy in the pelvis. No evidence for abdominal ascites or free fluid. Normal appearance of the bladder and prostate. Normal appearance of the right kidney without hydronephrosis. Incidentally, the patient has a duplicated right renal collecting system. Again noted is mild fullness of the left renal pelvis. There is a persistent stone in the mid left ureter which measures 6 mm. Limited evaluation for kidney stones due to  the post contrast images.  Normal appearance of the small bowel, large bowel and appendix.  Patient has an intramedullary rod in the proximal right femur. No acute bone abnormality.  Review of the MIP images confirms the above findings.  IMPRESSION: Enlargement of the upper abdominal and retroperitoneal lymphadenopathy. Findings are consistent with metastatic disease and, reportedly, patient has a history of liver cancer. There is a poorly defined lesion near the hepatic dome which could represent a liver neoplasm. There is  evidence for cirrhosis.  No evidence for abdominal ascites or free fluid.  Minimal left hydronephrosis with a stable 6 mm stone in the mid left ureter.  Limited evaluation for pulmonary emboli but no evidence for a large or central pulmonary embolism.  Areas of volume loss in the lungs without focal airspace disease.   Electronically Signed   By: Markus Daft M.D.   On: 06/27/2014 19:45    Patient seen by me. The patient diagnosed with liver cancer 6 months ago. Now known to be metastatic throughout the abdomen. Patient states that he's had significant increase of abdominal pain kind of all over starting this morning. Radiates up into the lower part of the chest. No persistent flank pain. Patient is due to start chemotherapy at the [facility] in [place] the middle of November. CT scan shows a persistent stone in the left ureter. Also shows evidence of lymphadenopathy consistent probably with metastatic disease. Liver shows evidence of cirrhosis which is a new known thing for the patient. The stone in the mid left ureter is unchanged from August and measures 6 mm in size. This most likely does not explain all of his pain. The patient has been having discomfort with urination. Urinalysis is negative for urinary tract infection. Patient's basic labs without any significant abnormalities.  Abdomen is distended seemed to have increased tenderness in the epigastric area. Normal bowel  sounds.  We'll discuss with urology about the persistent left ureter stone. At this point would recommend internal medicine admission for pain control also additional treatment depending on what urology thinks needs to be done with the persistent stone.  Fredia Sorrow, MD 06/27/14 2004
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
## 2014-06-27 NOTE — ED Notes (Signed)
The pt took x 2 oxycontin at 1100am today

## 2014-06-27 NOTE — ED Notes (Signed)
The pt was diagnosed with liver cancer 6 months ago.  For the past 3-4 days he has had more pain in his abd that radaites up into his chest.  He is to start on chemo the middle of this month  At the [facility] hospital

## 2014-06-27 NOTE — ED Notes (Signed)
Patient transported to CT 

## 2014-06-28 DIAGNOSIS — N1330 Unspecified hydronephrosis: Secondary | ICD-10-CM | POA: Diagnosis present

## 2014-06-28 DIAGNOSIS — E119 Type 2 diabetes mellitus without complications: Secondary | ICD-10-CM

## 2014-06-28 DIAGNOSIS — N200 Calculus of kidney: Secondary | ICD-10-CM

## 2014-06-28 DIAGNOSIS — C220 Liver cell carcinoma: Secondary | ICD-10-CM | POA: Diagnosis present

## 2014-06-28 DIAGNOSIS — F411 Generalized anxiety disorder: Secondary | ICD-10-CM | POA: Diagnosis present

## 2014-06-28 DIAGNOSIS — I10 Essential (primary) hypertension: Secondary | ICD-10-CM | POA: Diagnosis present

## 2014-06-28 LAB — COMPREHENSIVE METABOLIC PANEL
ALT: 21 U/L (ref 0–53)
ANION GAP: 14 (ref 5–15)
AST: 37 U/L (ref 0–37)
Albumin: 3.7 g/dL (ref 3.5–5.2)
Alkaline Phosphatase: 103 U/L (ref 39–117)
BILIRUBIN TOTAL: 0.6 mg/dL (ref 0.3–1.2)
BUN: 12 mg/dL (ref 6–23)
CHLORIDE: 100 meq/L (ref 96–112)
CO2: 26 mEq/L (ref 19–32)
CREATININE: 1.14 mg/dL (ref 0.50–1.35)
Calcium: 8.9 mg/dL (ref 8.4–10.5)
GFR calc Af Amer: 80 mL/min — ABNORMAL LOW (ref >90)
GFR calc non Af Amer: 69 mL/min — ABNORMAL LOW (ref >90)
Glucose, Bld: 168 mg/dL — ABNORMAL HIGH (ref 70–99)
Potassium: 4.3 mEq/L (ref 3.7–5.3)
Sodium: 140 mEq/L (ref 137–147)
Total Protein: 7.6 g/dL (ref 6.0–8.3)

## 2014-06-28 LAB — CBC
HCT: 39 % (ref 39.0–52.0)
Hemoglobin: 12.8 g/dL — ABNORMAL LOW (ref 13.0–17.0)
MCH: 28.8 pg (ref 26.0–34.0)
MCHC: 32.8 g/dL (ref 30.0–36.0)
MCV: 87.6 fL (ref 78.0–100.0)
PLATELETS: 251 10*3/uL (ref 150–400)
RBC: 4.45 MIL/uL (ref 4.22–5.81)
RDW: 13.3 % (ref 11.5–15.5)
WBC: 11.6 10*3/uL — ABNORMAL HIGH (ref 4.0–10.5)

## 2014-06-28 LAB — GLUCOSE, CAPILLARY
GLUCOSE-CAPILLARY: 233 mg/dL — AB (ref 70–99)
Glucose-Capillary: 178 mg/dL — ABNORMAL HIGH (ref 70–99)
Glucose-Capillary: 191 mg/dL — ABNORMAL HIGH (ref 70–99)
Glucose-Capillary: 217 mg/dL — ABNORMAL HIGH (ref 70–99)

## 2014-06-28 LAB — LACTIC ACID, PLASMA: Lactic Acid, Venous: 1.6 mmol/L (ref 0.5–2.2)

## 2014-06-28 MED ORDER — CEFTRIAXONE SODIUM 1 G IJ SOLR
1.0000 g | Freq: Once | INTRAMUSCULAR | Status: AC
  Administered 2014-06-28: 1 g via INTRAVENOUS
  Filled 2014-06-28: qty 10

## 2014-06-28 MED ORDER — SODIUM CHLORIDE 0.9 % IV BOLUS (SEPSIS)
500.0000 mL | Freq: Once | INTRAVENOUS | Status: AC
  Administered 2014-06-28: 500 mL via INTRAVENOUS

## 2014-06-28 MED ORDER — ENSURE COMPLETE PO LIQD
237.0000 mL | Freq: Two times a day (BID) | ORAL | Status: DC
  Administered 2014-06-28 – 2014-07-01 (×5): 237 mL via ORAL

## 2014-06-28 MED ORDER — FLEET ENEMA 7-19 GM/118ML RE ENEM
1.0000 | ENEMA | Freq: Once | RECTAL | Status: AC
  Administered 2014-06-28: 1 via RECTAL
  Filled 2014-06-28: qty 1

## 2014-06-28 MED ORDER — DEXTROSE 5 % IV SOLN
1.0000 g | INTRAVENOUS | Status: DC
  Administered 2014-06-29 – 2014-07-02 (×4): 1 g via INTRAVENOUS
  Filled 2014-06-28 (×4): qty 10

## 2014-06-28 MED ORDER — OXYCODONE HCL 5 MG PO TABS
10.0000 mg | ORAL_TABLET | ORAL | Status: DC | PRN
  Administered 2014-06-28 – 2014-07-02 (×11): 10 mg via ORAL
  Filled 2014-06-28 (×11): qty 2

## 2014-06-28 MED ORDER — FLEET ENEMA 7-19 GM/118ML RE ENEM
1.0000 | ENEMA | Freq: Every day | RECTAL | Status: DC | PRN
  Administered 2014-06-29: 1 via RECTAL
  Filled 2014-06-28 (×3): qty 1

## 2014-06-28 MED ORDER — ALPRAZOLAM 0.25 MG PO TABS: 0.2500 mg | ORAL_TABLET | Freq: Two times a day (BID) | ORAL | Status: DC | PRN

## 2014-06-28 MED ORDER — CIPROFLOXACIN HCL 500 MG PO TABS
500.0000 mg | ORAL_TABLET | Freq: Two times a day (BID) | ORAL | Status: DC
  Filled 2014-06-28: qty 1

## 2014-06-28 NOTE — Progress Notes (Signed)
PROGRESS NOTE  Stuart Moore VQM:[id number] DOB: 05/31/1956 DOA: 06/27/2014 PCP: [facility]  HPI: 58 yo male with PMH of hepatocellular carcinoma (scheduled for chemotherapy next week), cirrhosis, HTN, DM, hepatitis C, renal stone, and anxiety.  Pt presented to the ED with chronic abdominal pain which became progressively worse during the day on 11/04.  Pt experiencing sharp, stabbing pain which is worse with deep inspiration located in the epigastric region and lower abd.  Chronic pain is typically diffuse aching abdominal pain and less severe, normally 2/10.  Denies diarrhea, nausea, vomiting, chest pain or shortness of breath upon admission.   Subjective Pain is still severe this morning 6/10 but is now a aching/sore type of pain in his upper and lower abdomen.  Pain medication needed throughout the night to help with sleep.  Reports constipation- last BM 2 days ago, dysuria, sweats, and chills.  Denies any nausea, vomiting, or diarrhea today.         Assessment/Plan: Abdominal Pain: Likely cancer related pain He is on low dose narcotics at home, takes oxycodone 5 mg every 6 hours PRN Renal stone stable, minimal hydro on left Increase Oxycodone to 10mg  PRN.  Morphine PRN.  Continue Pantoprazole.            Dysuria:  Fever last night, patient with clinical evidence of a UTI with dysuria, leukocytosis. UA unremarkable Favor treating, obtain cultures  Hepatocellular carcinoma: Diagnosed approximately 6 mo ago.  Scheduled to receive chemotherapy at [facility] in [place].  Will continue to treat pain.     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  Unchanged.   Diabetes mellitus: Chronic problem.  Continue SSI and Lantus.    Essential hypertension: Chronic problem. Last BP 126/78.  Continue Lisinopril and Propranolol.       Anxiety state: Chronic problem.  Alprazolam  PRN.      Constipation: No BM in 2 days.  No relief with Colace.  Will give enema today. May play a role in his abdominal discomfort   DVT Prophylaxis:  Lovenox 40mg  daily  Code Status: Full Family Communication: Pt is awake and alert.   Disposition Plan: Will d/c home when medically appropriate.    Consultants:  Urology  Procedures:  None  Antibiotics:  Rocephin 1g IV  Objective: Filed Vitals:   06/28/14 0513 06/28/14 0521 06/28/14 0524 06/28/14 0938  BP: 104/63 99/65 98/60  126/78  Pulse: 82 81 100 99  Temp: 99.7 F (37.6 C) 99.7 F (37.6 C) 99.7 F (37.6 C)   TempSrc: Oral  Oral   Resp: 18 18 18    Height:      Weight:   84 kg (185 lb 3 oz)   SpO2: 92% 100% 100% 100%    Intake/Output Summary (Last 24 hours) at 06/28/14 1009 Last data filed at 06/27/14 2211  Gross per 24 hour  Intake      0 ml  Output    300 ml  Net   -300 ml   Filed Weights   06/27/14 2229 06/28/14 0524  Weight: 84.324 kg (185 lb 14.4 oz) 84 kg (185 lb 3 oz)    Exam: General: Uncomfortable, fatigued, Appears stated age  HEENT:  No scleral icterus, EOMI, MMM.   Neck: Supple, no JVD, no masses  Cardiovascular: RRR, S1 S2 auscultated, no rubs, murmurs or gallops.   Respiratory: Clear to auscultation bilaterally with equal chest  rise  Abdomen: Distended, diffuse tenderness to light palpation, + bowel sounds  Extremities: warm dry without cyanosis clubbing or edema.  Neuro: AAOx3, cranial nerves grossly intact.  Skin: Without rashes exudates or nodules.    Data Reviewed: Basic Metabolic Panel:  Recent Labs Lab 06/27/14 1620 06/28/14 0500  NA 141 140  K 4.5 4.3  CL 100 100  CO2 28 26  GLUCOSE 154* 168*  BUN 11 12  CREATININE 1.02 1.14  CALCIUM 9.6 8.9   Liver Function Tests:  Recent Labs Lab 06/27/14 1620 06/28/14 0500  AST 34 37  ALT 20 21  ALKPHOS 131* 103  BILITOT 0.2* 0.6  PROT 8.7* 7.6  ALBUMIN 4.1 3.7    Recent Labs Lab 06/27/14 1620  LIPASE 38    CBC:  Recent Labs Lab 06/27/14 1620 06/28/14 0500  WBC 11.6* 11.6*  NEUTROABS 6.0  --   HGB 13.8 12.8*  HCT 42.5 39.0  MCV 87.4 87.6  PLT 289 251   Cardiac Enzymes:  Recent Labs Lab 06/27/14 1822  TROPONINI <0.30   CBG:  Recent Labs Lab 06/27/14 2156 06/27/14 2308 06/28/14 0737  GLUCAP 173* 206* 178*    Studies: Ct Angio Chest Pe W/cm &/or Wo Cm  06/27/2014   CLINICAL DATA:  Increased abdominal pain for 3-4 days. History of liver cancer. Patient starting chemotherapy.  EXAM: CT ANGIOGRAPHY CHEST  CT ABDOMEN AND PELVIS WITH CONTRAST  TECHNIQUE: Multidetector CT imaging of the chest was performed using the standard protocol during bolus administration of intravenous contrast. Multiplanar CT image reconstructions and MIPs were obtained to evaluate the vascular anatomy. Multidetector CT imaging of the abdomen and pelvis was performed using the standard protocol during bolus administration of intravenous contrast.  CONTRAST:  38mL OMNIPAQUE IOHEXOL 350 MG/ML SOLN  COMPARISON:  Abdominal CT 04/22/2014  FINDINGS: CTA CHEST FINDINGS  The contrast bolus is not optimal for evaluation of the pulmonary arteries but there is no evidence for large or central pulmonary embolism. The thoracic aorta is well opacified with contrast and there is no evidence for a dissection or aneurysm. The great vessels are patent. Mild dilatation of the esophagus with an air-fluid level. There is no significant chest lymphadenopathy. No significant pericardial or pleural fluid. Patchy densities in the lower lobes are most compatible with atelectasis. No significant airspace disease. No acute bone abnormality.  CT ABDOMEN and PELVIS FINDINGS  Negative for free intraperitoneal air. There is a poorly defined low-density lesion along the anterior dome of the liver. This area roughly measures 1.9 cm and may represent the known liver malignancy. Liver contour is mildly nodular and suspect underlying cirrhosis. Normal  appearance of the gallbladder. Normal appearance of the spleen and adrenal glands. Normal appearance of the pancreas. Again noted are markedly enlarged lymph nodes in the upper abdomen and porta hepatis region. Index lymph node in the precaval space measures 4.0 cm in the AP dimension and previously measured 3.5 cm. Large lymph node posterior to the gastric antrum measures 4.1 cm in AP dimension and previously measured 3.5 cm. Large lymph node in the porta hepatis is low-density and likely necrotic. This node measures 3.1 cm in the short axis and previously measured 2.7 cm. This large porta hepatis node causes some compression of the distal main portal vein but the portal venous system is patent. No significant dilatation of the stomach. Small lymph nodes in the gastrohepatic ligament region. Lymph node between the aorta and IVC measures 2.3 cm in short axis and previously  measured 1.9 cm. There are additional retroperitoneal lymph nodes that demonstrate mild enlargement.  There is no significant lymphadenopathy in the pelvis. No evidence for abdominal ascites or free fluid. Normal appearance of the bladder and prostate. Normal appearance of the right kidney without hydronephrosis. Incidentally, the patient has a duplicated right renal collecting system. Again noted is mild fullness of the left renal pelvis. There is a persistent stone in the mid left ureter which measures 6 mm. Limited evaluation for kidney stones due to the post contrast images.  Normal appearance of the small bowel, large bowel and appendix.  Patient has an intramedullary rod in the proximal right femur. No acute bone abnormality.  Review of the MIP images confirms the above findings.  IMPRESSION: Enlargement of the upper abdominal and retroperitoneal lymphadenopathy. Findings are consistent with metastatic disease and, reportedly, patient has a history of liver cancer. There is a poorly defined lesion near the hepatic dome which could represent  a liver neoplasm. There is evidence for cirrhosis.  No evidence for abdominal ascites or free fluid.  Minimal left hydronephrosis with a stable 6 mm stone in the mid left ureter.  Limited evaluation for pulmonary emboli but no evidence for a large or central pulmonary embolism.  Areas of volume loss in the lungs without focal airspace disease.   Electronically Signed   By: Markus Daft M.D.   On: 06/27/2014 19:45   Ct Abdomen Pelvis W Contrast  06/27/2014   CLINICAL DATA:  Increased abdominal pain for 3-4 days. History of liver cancer. Patient starting chemotherapy.  EXAM: CT ANGIOGRAPHY CHEST  CT ABDOMEN AND PELVIS WITH CONTRAST  TECHNIQUE: Multidetector CT imaging of the chest was performed using the standard protocol during bolus administration of intravenous contrast. Multiplanar CT image reconstructions and MIPs were obtained to evaluate the vascular anatomy. Multidetector CT imaging of the abdomen and pelvis was performed using the standard protocol during bolus administration of intravenous contrast.  CONTRAST:  52mL OMNIPAQUE IOHEXOL 350 MG/ML SOLN  COMPARISON:  Abdominal CT 04/22/2014  FINDINGS: CTA CHEST FINDINGS  The contrast bolus is not optimal for evaluation of the pulmonary arteries but there is no evidence for large or central pulmonary embolism. The thoracic aorta is well opacified with contrast and there is no evidence for a dissection or aneurysm. The great vessels are patent. Mild dilatation of the esophagus with an air-fluid level. There is no significant chest lymphadenopathy. No significant pericardial or pleural fluid. Patchy densities in the lower lobes are most compatible with atelectasis. No significant airspace disease. No acute bone abnormality.  CT ABDOMEN and PELVIS FINDINGS  Negative for free intraperitoneal air. There is a poorly defined low-density lesion along the anterior dome of the liver. This area roughly measures 1.9 cm and may represent the known liver malignancy. Liver  contour is mildly nodular and suspect underlying cirrhosis. Normal appearance of the gallbladder. Normal appearance of the spleen and adrenal glands. Normal appearance of the pancreas. Again noted are markedly enlarged lymph nodes in the upper abdomen and porta hepatis region. Index lymph node in the precaval space measures 4.0 cm in the AP dimension and previously measured 3.5 cm. Large lymph node posterior to the gastric antrum measures 4.1 cm in AP dimension and previously measured 3.5 cm. Large lymph node in the porta hepatis is low-density and likely necrotic. This node measures 3.1 cm in the short axis and previously measured 2.7 cm. This large porta hepatis node causes some compression of the distal main portal vein  but the portal venous system is patent. No significant dilatation of the stomach. Small lymph nodes in the gastrohepatic ligament region. Lymph node between the aorta and IVC measures 2.3 cm in short axis and previously measured 1.9 cm. There are additional retroperitoneal lymph nodes that demonstrate mild enlargement.  There is no significant lymphadenopathy in the pelvis. No evidence for abdominal ascites or free fluid. Normal appearance of the bladder and prostate. Normal appearance of the right kidney without hydronephrosis. Incidentally, the patient has a duplicated right renal collecting system. Again noted is mild fullness of the left renal pelvis. There is a persistent stone in the mid left ureter which measures 6 mm. Limited evaluation for kidney stones due to the post contrast images.  Normal appearance of the small bowel, large bowel and appendix.  Patient has an intramedullary rod in the proximal right femur. No acute bone abnormality.  Review of the MIP images confirms the above findings.  IMPRESSION: Enlargement of the upper abdominal and retroperitoneal lymphadenopathy. Findings are consistent with metastatic disease and, reportedly, patient has a history of liver cancer. There is a  poorly defined lesion near the hepatic dome which could represent a liver neoplasm. There is evidence for cirrhosis.  No evidence for abdominal ascites or free fluid.  Minimal left hydronephrosis with a stable 6 mm stone in the mid left ureter.  Limited evaluation for pulmonary emboli but no evidence for a large or central pulmonary embolism.  Areas of volume loss in the lungs without focal airspace disease.   Electronically Signed   By: Markus Daft M.D.   On: 06/27/2014 19:45    Scheduled Meds: . cefTRIAXone (ROCEPHIN)  IV  1 g Intravenous Once  . [START ON 06/29/2014] ciprofloxacin  500 mg Oral BID  . docusate sodium  100 mg Oral BID  . enoxaparin (LOVENOX) injection  40 mg Subcutaneous Daily  . insulin aspart  0-5 Units Subcutaneous QHS  . insulin aspart  0-9 Units Subcutaneous TID WC  . insulin glargine  30 Units Subcutaneous QHS  . lisinopril  40 mg Oral Daily  . pantoprazole  40 mg Oral BID AC  . polyethylene glycol  17 g Oral Daily  . propranolol  20 mg Oral BID  . tamsulosin  0.4 mg Oral Daily   Continuous Infusions:   Principal Problem:   Abdominal pain Active Problems:   Hepatocellular carcinoma   Renal stone   Hydronephrosis   Diabetes mellitus   Anxiety state   Essential hypertension    Adelene Idler PA-S  [facility] Pager [phone]. If 7PM-7AM, please contact night-coverage at [email or web address], password [facility] 06/28/2014, 10:09 AM  LOS: 1 day       Patient seen and examined, chart and data base reviewed, note edited above  Acute on chronic abdominal pain likely multifactorial related to his cancer, constipation. Pain is constant, without any flank pain and not colicky in nature. CT scan with minimal hydronephrosis on left and with non obstructive stone, stable.   Will provide enema to help with constipation, increase pain medications  Has dysuria, leukocytosis, febrile, would favor treating as a UTI for now.    Marzetta Board, MD [facility] [phone]
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
## 2014-06-28 NOTE — Progress Notes (Signed)
UR completed 

## 2014-06-28 NOTE — Plan of Care (Signed)
Problem: Phase II Progression Outcomes Goal: IV changed to normal saline lock Outcome: Completed/Met Date Met:  06/28/14     

## 2014-06-28 NOTE — Progress Notes (Signed)
Dr. Cruzita Lederer paged about patient asking if he needs another fleet enema. Pt received a fleet enema today, miralax and colace.

## 2014-06-28 NOTE — Progress Notes (Signed)
Pt temp 101.8. Given PRN tylenol. Will recheck temp in an hr.

## 2014-06-29 DIAGNOSIS — K5900 Constipation, unspecified: Secondary | ICD-10-CM | POA: Diagnosis not present

## 2014-06-29 DIAGNOSIS — H5441 Blindness, right eye, normal vision left eye: Secondary | ICD-10-CM | POA: Diagnosis present

## 2014-06-29 DIAGNOSIS — Z79899 Other long term (current) drug therapy: Secondary | ICD-10-CM | POA: Diagnosis not present

## 2014-06-29 DIAGNOSIS — E119 Type 2 diabetes mellitus without complications: Secondary | ICD-10-CM | POA: Diagnosis present

## 2014-06-29 DIAGNOSIS — Z794 Long term (current) use of insulin: Secondary | ICD-10-CM | POA: Diagnosis not present

## 2014-06-29 DIAGNOSIS — F411 Generalized anxiety disorder: Secondary | ICD-10-CM | POA: Diagnosis present

## 2014-06-29 DIAGNOSIS — Z8505 Personal history of malignant neoplasm of liver: Secondary | ICD-10-CM | POA: Diagnosis not present

## 2014-06-29 DIAGNOSIS — G893 Neoplasm related pain (acute) (chronic): Secondary | ICD-10-CM | POA: Diagnosis present

## 2014-06-29 DIAGNOSIS — Z87891 Personal history of nicotine dependence: Secondary | ICD-10-CM | POA: Diagnosis not present

## 2014-06-29 DIAGNOSIS — C799 Secondary malignant neoplasm of unspecified site: Secondary | ICD-10-CM | POA: Diagnosis present

## 2014-06-29 DIAGNOSIS — Z87442 Personal history of urinary calculi: Secondary | ICD-10-CM | POA: Diagnosis not present

## 2014-06-29 DIAGNOSIS — R509 Fever, unspecified: Secondary | ICD-10-CM | POA: Diagnosis present

## 2014-06-29 DIAGNOSIS — B1920 Unspecified viral hepatitis C without hepatic coma: Secondary | ICD-10-CM | POA: Diagnosis present

## 2014-06-29 DIAGNOSIS — I10 Essential (primary) hypertension: Secondary | ICD-10-CM | POA: Diagnosis present

## 2014-06-29 DIAGNOSIS — Z791 Long term (current) use of non-steroidal anti-inflammatories (NSAID): Secondary | ICD-10-CM | POA: Diagnosis not present

## 2014-06-29 DIAGNOSIS — C220 Liver cell carcinoma: Secondary | ICD-10-CM | POA: Diagnosis present

## 2014-06-29 DIAGNOSIS — R109 Unspecified abdominal pain: Secondary | ICD-10-CM | POA: Diagnosis present

## 2014-06-29 DIAGNOSIS — N200 Calculus of kidney: Secondary | ICD-10-CM | POA: Diagnosis present

## 2014-06-29 DIAGNOSIS — K219 Gastro-esophageal reflux disease without esophagitis: Secondary | ICD-10-CM | POA: Diagnosis present

## 2014-06-29 DIAGNOSIS — N132 Hydronephrosis with renal and ureteral calculous obstruction: Secondary | ICD-10-CM | POA: Diagnosis present

## 2014-06-29 DIAGNOSIS — Z79891 Long term (current) use of opiate analgesic: Secondary | ICD-10-CM | POA: Diagnosis not present

## 2014-06-29 DIAGNOSIS — K7460 Unspecified cirrhosis of liver: Secondary | ICD-10-CM | POA: Diagnosis present

## 2014-06-29 LAB — URINE CULTURE
Colony Count: NO GROWTH
Culture: NO GROWTH

## 2014-06-29 LAB — GLUCOSE, CAPILLARY
GLUCOSE-CAPILLARY: 264 mg/dL — AB (ref 70–99)
GLUCOSE-CAPILLARY: 221 mg/dL — AB (ref 70–99)
Glucose-Capillary: 177 mg/dL — ABNORMAL HIGH (ref 70–99)
Glucose-Capillary: 223 mg/dL — ABNORMAL HIGH (ref 70–99)
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
## 2014-06-29 NOTE — Progress Notes (Addendum)
PROGRESS NOTE  JEREMAINE Moore GDJ:[id number] DOB: [birth year] DOA: 06/27/2014 PCP: [facility]  HPI: 58 yo male with PMH of hepatocellular carcinoma (scheduled for chemotherapy next week), cirrhosis, HTN, DM, hepatitis C, renal stone, and anxiety.  Pt presented to the ED with chronic abdominal pain which became progressively worse during the day on 11/04.  Pt experiencing sharp, stabbing pain which is worse with deep inspiration located in the epigastric region and lower abd.  Chronic pain is typically diffuse aching abdominal pain and less severe, normally 2/10.  Denies diarrhea, nausea, vomiting, chest pain or shortness of breath upon admission.   Subjective Pain is still severe especially with deep inspiration.  Pt was able to tolerate pain during the night without medication.  Reports constipation - last BM 3 days ago.  Denies any myalgias, cuts, wounds, cough, or respiratory symptoms.          Assessment/Plan: Abdominal Pain: Likely cancer related pain He is on low dose narcotics at home, takes oxycodone 5 mg every 6 hours PRN Renal stone stable, minimal hydro on left Increase Oxycodone to 10mg  PRN.  Morphine PRN.  Continue Pantoprazole.      Fever  - Patient febrile yesterday and throughout the night, he is relatively asymptomatic from it - no respiratory complaints - I discussed with Dr. Risa Grill Last night about patient's fevers and persistent stone, he will talk and discuss with Dr. Alinda Money today about him. Awaiting input.  Dysuria:  Fever overnight, patient with clinical evidence of a UTI with dysuria, leukocytosis. UA unremarkable.  Lactic acid 1.6.  No evidence of sepsis currently.  Consulting with pt's urologist.  Will continue treatment with IV Rocephin.  Urine and blood cultures pending.        Hepatocellular carcinoma: Diagnosed approximately 6 mo ago.  Scheduled to receive chemotherapy at [facility] in [place].  Will continue to treat pain.     Liver cirrhosis due  to HCV, remote ETOH - without evidence of decompensation  Renal stone: 77mm renal stone with mild hydronephrosis identified 03/2014.  Unchanged.   Diabetes mellitus: Chronic problem.  Continue SSI and Lantus.    Essential hypertension: Chronic problem. Last BP 122/72.  Continue Lisinopril and Propranolol.       Anxiety state: Chronic problem.  Alprazolam PRN.      Constipation: No BM in 3 days.  No relief with Colace.  No relief with enema.  Will try additional enema.  May play a role in his abdominal discomfort   DVT Prophylaxis:  Lovenox 40mg  daily  Diet: Carb modified   Code Status: Full Family Communication: none today  Disposition Plan: Will d/c home when medically appropriate.    Consultants:  Urology  Procedures:  None  Antibiotics:  Rocephin 1g IV  Objective: Filed Vitals:   06/28/14 2213 06/28/14 2337 06/29/14 0558 06/29/14 0619  BP: 115/72  122/72   Pulse: 94  95   Temp: 101.8 F (38.8 C) 99.8 F (37.7 C) 100.7 F (38.2 C) 99.8 F (37.7 C)  TempSrc: Oral  Oral Oral  Resp: 18  20   Height:      Weight:   85.3 kg (188 lb 0.8 oz)   SpO2: 90%  95%     Intake/Output Summary (Last 24 hours) at 06/29/14 0948 Last data filed at 06/29/14 6222  Gross per 24 hour  Intake    967 ml  Output    100 ml  Net    867 ml   Filed  Weights   06/27/14 2229 06/28/14 0524 06/29/14 0558  Weight: 84.324 kg (185 lb 14.4 oz) 84 kg (185 lb 3 oz) 85.3 kg (188 lb 0.8 oz)   Exam: General: NAD, Appears stated age  HEENT:  No scleral icterus, MMM.   Cardiovascular: RRR, S1 S2 auscultated, no rubs, murmurs or gallops.   Respiratory: Clear to auscultation bilaterally with equal chest rise Abdomen: Distended, diffuse tenderness to light palpation, + bowel sounds  Extremities: warm dry without cyanosis clubbing or edema.  Skin: Without rashes exudates or nodules.    Data Reviewed: Basic Metabolic Panel:  Recent Labs Lab 06/27/14 1620 06/28/14 0500  NA 141 140  K 4.5  4.3  CL 100 100  CO2 28 26  GLUCOSE 154* 168*  BUN 11 12  CREATININE 1.02 1.14  CALCIUM 9.6 8.9   Liver Function Tests:  Recent Labs Lab 06/27/14 1620 06/28/14 0500  AST 34 37  ALT 20 21  ALKPHOS 131* 103  BILITOT 0.2* 0.6  PROT 8.7* 7.6  ALBUMIN 4.1 3.7    Recent Labs Lab 06/27/14 1620  LIPASE 38   CBC:  Recent Labs Lab 06/27/14 1620 06/28/14 0500  WBC 11.6* 11.6*  NEUTROABS 6.0  --   HGB 13.8 12.8*  HCT 42.5 39.0  MCV 87.4 87.6  PLT 289 251   Cardiac Enzymes:  Recent Labs Lab 06/27/14 1822  TROPONINI <0.30   CBG:  Recent Labs Lab 06/28/14 0737 06/28/14 1144 06/28/14 1713 06/28/14 2210 06/29/14 0754  GLUCAP 178* 233* 191* 217* 177*    Studies: Ct Angio Chest Pe W/cm &/or Wo Cm 06/27/2014  Enlargement of the upper abdominal and retroperitoneal lymphadenopathy. Findings are consistent with metastatic disease and, reportedly, patient has a history of liver cancer. There is a poorly defined lesion near the hepatic dome which could represent a liver neoplasm. There is evidence for cirrhosis.  No evidence for abdominal ascites or free fluid.  Minimal left hydronephrosis with a stable 6 mm stone in the mid left ureter.  Limited evaluation for pulmonary emboli but no evidence for a large or central pulmonary embolism.  Areas of volume loss in the lungs without focal airspace disease.  Ct Abdomen Pelvis W Contrast 06/27/2014 Enlargement of the upper abdominal and retroperitoneal lymphadenopathy. Findings are consistent with metastatic disease and, reportedly, patient has a history of liver cancer. There is a poorly defined lesion near the hepatic dome which could represent a liver neoplasm. There is evidence for cirrhosis.  No evidence for abdominal ascites or free fluid.  Minimal left hydronephrosis with a stable 6 mm stone in the mid left ureter.  Limited evaluation for pulmonary emboli but no evidence for a large or central pulmonary embolism.  Areas of volume  loss in the lungs without focal airspace disease   Scheduled Meds: . cefTRIAXone (ROCEPHIN)  IV  1 g Intravenous Q24H  . docusate sodium  100 mg Oral BID  . enoxaparin (LOVENOX) injection  40 mg Subcutaneous Daily  . feeding supplement (ENSURE COMPLETE)  237 mL Oral BID BM  . insulin aspart  0-5 Units Subcutaneous QHS  . insulin aspart  0-9 Units Subcutaneous TID WC  . insulin glargine  30 Units Subcutaneous QHS  . pantoprazole  40 mg Oral BID AC  . polyethylene glycol  17 g Oral Daily  . tamsulosin  0.4 mg Oral Daily   Continuous Infusions:   Principal Problem:   Abdominal pain Active Problems:   Hepatocellular carcinoma   Renal stone  Hydronephrosis   Diabetes mellitus   Anxiety state   Essential hypertension Fever    Adelene Idler PA-S  [facility] Pager [phone]. If 7PM-7AM, please contact night-coverage at [email or web address], password [facility] 06/29/2014, 9:48 AM  LOS: 2 days       Patient seen and examined, chart and data base reviewed.  I agree with the above assessment and plan, I have reviewed and edited note.  Fever without clear etiology ?tumor related, will discuss with Urology again today, may need ID input as well..he is about to have chemo in 2 weeks which can complicate a potential occult source.   Marzetta Board, MD [facility] [phone]
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
## 2014-06-29 NOTE — Progress Notes (Signed)
Dr. Cruzita Lederer made aware that patient's temp remains elevated at 101.9 orally after tylenol. No new orders received at this time. Patient comfortable with no complaints, otherwise asymptomatic.

## 2014-06-29 NOTE — Plan of Care (Signed)
Problem: Phase I Progression Outcomes Goal: Pain controlled with appropriate interventions Outcome: Completed/Met Date Met:  06/29/14 Goal: OOB as tolerated unless otherwise ordered Outcome: Completed/Met Date Met:  06/29/14 Goal: Hemodynamically stable Outcome: Completed/Met Date Met:  06/29/14

## 2014-06-29 NOTE — Anesthesia Preprocedure Evaluation (Addendum)
Anesthesia Evaluation  Patient identified by MRN, date of birth, ID band Patient awake    Reviewed: Allergy & Precautions, H&P , NPO status , Patient's Chart, lab work & pertinent test results  History of Anesthesia Complications Negative for: history of anesthetic complications (patient denies any problems with anesthesia in the past)  Airway Mallampati: III  TM Distance: >3 FB Neck ROM: Full    Dental no notable dental hx. (+) Edentulous Upper, Edentulous Lower   Pulmonary neg sleep apnea (patient denies history of sleep apnea), former smoker,  breath sounds clear to auscultation  Pulmonary exam normal       Cardiovascular hypertension, Pt. on medications and Pt. on home beta blockers + Valvular Problems/Murmurs Rhythm:Regular Rate:Normal     Neuro/Psych  Headaches, PSYCHIATRIC DISORDERS Anxiety Depression    GI/Hepatic GERD-  Medicated and Controlled,(+) Cirrhosis -  ascites  substance abuse (hx of etoh and cocaine use, denies any use for several years)  , Hepatitis -, CHx of hepatocellular carcinoma    Endo/Other  diabetes, Type 2, Insulin Dependent  Renal/GU Renal disease  negative genitourinary   Musculoskeletal negative musculoskeletal ROS (+)   Abdominal   Peds negative pediatric ROS (+)  Hematology negative hematology ROS (+)   Anesthesia Other Findings   Reproductive/Obstetrics negative OB ROS                            Anesthesia Physical Anesthesia Plan  ASA: III and emergent  Anesthesia Plan: General   Post-op Pain Management:    Induction: Intravenous, Rapid sequence and Cricoid pressure planned  Airway Management Planned: Oral ETT  Additional Equipment:   Intra-op Plan:   Post-operative Plan: Extubation in OR  Informed Consent: I have reviewed the patients History and Physical, chart, labs and discussed the procedure including the risks, benefits and  alternatives for the proposed anesthesia with the patient or authorized representative who has indicated his/her understanding and acceptance.   Dental advisory given  Plan Discussed with: CRNA  Anesthesia Plan Comments:        Anesthesia Quick Evaluation

## 2014-06-29 NOTE — Progress Notes (Signed)
Schorr, NP ordered 500 cc bolus. Follow up temp 99.8.

## 2014-06-29 NOTE — Progress Notes (Signed)
UR completed 

## 2014-06-29 NOTE — Progress Notes (Signed)
Patient escorted via [facility] to [facility]

## 2014-06-29 NOTE — Plan of Care (Signed)
Problem: Phase II Progression Outcomes Goal: Progress activity as tolerated unless otherwise ordered Outcome: Completed/Met Date Met:  06/29/14 Goal: Discharge plan established Outcome: Completed/Met Date Met:  06/29/14 Goal: Vital signs remain stable Outcome: Progressing Goal: Obtain order to discontinue catheter if appropriate Outcome: Not Applicable Date Met:  78/47/84

## 2014-06-29 NOTE — Plan of Care (Signed)
Problem: Phase I Progression Outcomes Goal: Vital Signs stable- temperature less than 102 Outcome: Completed/Met Date Met:  06/29/14     

## 2014-06-30 ENCOUNTER — Encounter (HOSPITAL_COMMUNITY): Payer: Self-pay | Admitting: Registered Nurse

## 2014-06-30 ENCOUNTER — Inpatient Hospital Stay (HOSPITAL_COMMUNITY): Payer: Non-veteran care | Admitting: Anesthesiology

## 2014-06-30 ENCOUNTER — Encounter (HOSPITAL_COMMUNITY)
Admission: EM | Disposition: A | Discharge status: Home / Self Care | Payer: Self-pay | Source: Home / Self Care | Attending: Internal Medicine

## 2014-06-30 HISTORY — PX: CYSTOSCOPY/RETROGRADE/URETEROSCOPY: SHX5316

## 2014-06-30 LAB — GLUCOSE, CAPILLARY
GLUCOSE-CAPILLARY: 193 mg/dL — AB (ref 70–99)
GLUCOSE-CAPILLARY: 185 mg/dL — AB (ref 70–99)
Glucose-Capillary: 215 mg/dL — ABNORMAL HIGH (ref 70–99)
Glucose-Capillary: 241 mg/dL — ABNORMAL HIGH (ref 70–99)
Glucose-Capillary: 165 mg/dL — ABNORMAL HIGH (ref 70–99)

## 2014-06-30 LAB — SURGICAL PCR SCREEN
MRSA, PCR: NEGATIVE
STAPHYLOCOCCUS AUREUS: NEGATIVE

## 2014-06-30 SURGERY — CYSTOSCOPY/RETROGRADE/URETEROSCOPY
Anesthesia: General | Site: Ureter | Laterality: Left

## 2014-06-30 MED ORDER — FENTANYL CITRATE 0.05 MG/ML IJ SOLN: 25.0000 ug | INTRAMUSCULAR | Status: DC | PRN

## 2014-06-30 MED ORDER — ONDANSETRON HCL 4 MG/2ML IJ SOLN
INTRAMUSCULAR | Status: AC
  Filled 2014-06-30: qty 2

## 2014-06-30 MED ORDER — MIDAZOLAM HCL 2 MG/2ML IJ SOLN
INTRAMUSCULAR | Status: AC
  Filled 2014-06-30: qty 2

## 2014-06-30 MED ORDER — SODIUM CHLORIDE 0.9 % IR SOLN
Status: DC | PRN
  Administered 2014-06-30: 3000 mL

## 2014-06-30 MED ORDER — LABETALOL HCL 5 MG/ML IV SOLN
INTRAVENOUS | Status: DC | PRN
  Administered 2014-06-30: 7.5 mg via INTRAVENOUS

## 2014-06-30 MED ORDER — ONDANSETRON HCL 4 MG/2ML IJ SOLN: 4.0000 mg | Freq: Once | INTRAMUSCULAR | Status: AC | PRN

## 2014-06-30 MED ORDER — PROPOFOL 10 MG/ML IV BOLUS
INTRAVENOUS | Status: DC | PRN
  Administered 2014-06-30: 200 mg via INTRAVENOUS
  Administered 2014-06-30: 30 mg via INTRAVENOUS

## 2014-06-30 MED ORDER — SUCCINYLCHOLINE CHLORIDE 20 MG/ML IJ SOLN
INTRAMUSCULAR | Status: DC | PRN
  Administered 2014-06-30: 100 mg via INTRAVENOUS

## 2014-06-30 MED ORDER — PROPOFOL 10 MG/ML IV BOLUS
INTRAVENOUS | Status: AC
  Filled 2014-06-30: qty 20

## 2014-06-30 MED ORDER — MIDAZOLAM HCL 5 MG/5ML IJ SOLN
INTRAMUSCULAR | Status: DC | PRN
  Administered 2014-06-30: 2 mg via INTRAVENOUS

## 2014-06-30 MED ORDER — FENTANYL CITRATE 0.05 MG/ML IJ SOLN
INTRAMUSCULAR | Status: AC
  Filled 2014-06-30: qty 5

## 2014-06-30 MED ORDER — FENTANYL CITRATE 0.05 MG/ML IJ SOLN
INTRAMUSCULAR | Status: DC | PRN
  Administered 2014-06-30: 100 ug via INTRAVENOUS
  Administered 2014-06-30: 50 ug via INTRAVENOUS
  Administered 2014-06-30: 100 ug via INTRAVENOUS

## 2014-06-30 MED ORDER — MAGNESIUM CITRATE PO SOLN
1.0000 | Freq: Once | ORAL | Status: AC
  Administered 2014-06-30: 1 via ORAL

## 2014-06-30 MED ORDER — LIDOCAINE HCL (CARDIAC) 20 MG/ML IV SOLN
INTRAVENOUS | Status: DC | PRN
  Administered 2014-06-30: 50 mg via INTRAVENOUS

## 2014-06-30 MED ORDER — LACTATED RINGERS IV SOLN
INTRAVENOUS | Status: DC | PRN
  Administered 2014-06-30: 10:00:00 via INTRAVENOUS

## 2014-06-30 SURGICAL SUPPLY — 14 items
BAG URO CATCHER STRL LF (DRAPE) ×3 IMPLANT
BASKET ZERO TIP NITINOL 2.4FR (BASKET) IMPLANT
BSKT STON RTRVL ZERO TP 2.4FR (BASKET)
CATH INTERMIT  6FR 70CM (CATHETERS) ×2 IMPLANT
DRAPE CAMERA CLOSED 9X96 (DRAPES) ×3 IMPLANT
GLOVE BIOGEL M STRL SZ7.5 (GLOVE) ×3 IMPLANT
GOWN STRL REUS W/TWL LRG LVL3 (GOWN DISPOSABLE) ×6 IMPLANT
GUIDEWIRE ANG ZIPWIRE 038X150 (WIRE) ×2 IMPLANT
GUIDEWIRE STR DUAL SENSOR (WIRE) ×3 IMPLANT
MANIFOLD NEPTUNE II (INSTRUMENTS) ×3 IMPLANT
PACK CYSTO (CUSTOM PROCEDURE TRAY) ×3 IMPLANT
STENT CONTOUR 6FRX24X.038 (STENTS) ×2 IMPLANT
TUBING CONNECTING 10 (TUBING) ×2 IMPLANT
TUBING CONNECTING 10' (TUBING) ×1

## 2014-06-30 NOTE — Plan of Care (Signed)
Problem: Phase II Progression Outcomes Goal: Vital signs remain stable Outcome: Completed/Met Date Met:  06/30/14  Problem: Phase I Progression Outcomes Goal: Voiding-avoid urinary catheter unless indicated Outcome: Completed/Met Date Met:  06/30/14 Goal: Adequate I & O Outcome: Completed/Met Date Met:  06/30/14 Goal: Tolerating diet Outcome: Completed/Met Date Met:  06/30/14  Problem: Phase II Progression Outcomes Goal: Progress activity as tolerated unless otherwise ordered Outcome: Completed/Met Date Met:  06/30/14

## 2014-06-30 NOTE — Progress Notes (Signed)
PROGRESS NOTE  Stuart Moore YQM:[id number] DOB: 16-Jan-1956 DOA: 06/27/2014 PCP: [facility]  HPI: 58 yo male with PMH of hepatocellular carcinoma (scheduled for chemotherapy next week), cirrhosis, HTN, DM, hepatitis C, renal stone, and anxiety.  Pt presented to the ED with chronic abdominal pain which became progressively worse during the day on 11/04.  Pt experiencing sharp, stabbing pain which is worse with deep inspiration located in the epigastric region and lower abd.  Chronic pain is typically diffuse aching abdominal pain and less severe, normally 2/10.  Denies diarrhea, nausea, vomiting, chest pain or shortness of breath upon admission.   Subjective Pain is still severe especially with deep inspiration.  Pt was able to tolerate pain during the night without medication.  Reports constipation - last BM 3 days ago.  Denies any myalgias, cuts, wounds, cough, or respiratory symptoms.          Assessment/Plan:  Abdominal Pain: Likely cancer related pain He is on low dose narcotics at home, takes oxycodone 5 mg every 6 hours PRN Renal stone stable, minimal hydro on left Increase Oxycodone to 10mg  PRN.  Morphine PRN.  Continue Pantoprazole.      Fever  Fever of 101.9 yesterday. Cannot rule out malignancy related fever. CT of abdomen/pelvis did not show any intra-abdominal pathology other than the findings related to the cancer. Urinalysis clear, blood and urine culture obtained. No clear source of infection, patient has left-sided renal stone but urinalysis was clear. Undergone cystoscopy and left ureteral stent placement done today by Dr. Alinda Money.  Dysuria:  Fever overnight, patient with clinical evidence of a UTI with dysuria, leukocytosis. UA unremarkable.   Lactic acid 1.6.  No evidence of sepsis currently.  Consulting with pt's urologist.  Will continue treatment with IV Rocephin.  Urine and blood cultures pending.        Hepatocellular carcinoma: Diagnosed  approximately 6 mo ago.  Scheduled to receive chemotherapy at [facility] in [place].  Will continue to treat pain.     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  Unchanged.   Diabetes mellitus: Chronic problem.  Continue SSI and Lantus.    Essential hypertension: Chronic problem. Last BP 122/72.  Continue Lisinopril and Propranolol.       Anxiety state: Chronic problem. Alprazolam PRN.      Constipation: No BM in 3 days.  No relief with Colace.  No relief with enema.  Will try additional enema.  May play a role in his abdominal discomfort   DVT Prophylaxis:  Lovenox 40mg  daily  Diet: Carb modified   Code Status: Full Family Communication: none today  Disposition Plan: Will d/c home when medically appropriate.    Consultants:  Urology  Procedures:  Left ureteral stent placement and cystoscopy done on 11/7  Antibiotics:  Rocephin 1g IV  Objective: Filed Vitals:   06/30/14 1100 06/30/14 1105 06/30/14 1110 06/30/14 1115  BP: 135/80   132/82  Pulse: 83 82 81 81  Temp: 98.8 F (37.1 C)   98.8 F (37.1 C)  TempSrc:      Resp: 27 26 24 30   Height:      Weight:      SpO2: 96% 94% 96% 96%    Intake/Output Summary (Last 24 hours) at 06/30/14 1134 Last data filed at 06/30/14 1118  Gross per 24 hour  Intake    920 ml  Output  475 ml  Net    445 ml   Filed Weights   06/28/14 0524 06/29/14 0558 06/29/14 1744  Weight: 84 kg (185 lb 3 oz) 85.3 kg (188 lb 0.8 oz) 83.8 kg (184 lb 11.9 oz)   Exam: General: NAD, Appears stated age  HEENT:  No scleral icterus, MMM.   Cardiovascular: RRR, S1 S2 auscultated, no rubs, murmurs or gallops.   Respiratory: Clear to auscultation bilaterally with equal chest rise Abdomen: Distended, diffuse tenderness to light palpation, + bowel sounds  Extremities: warm dry without cyanosis clubbing or edema.  Skin: Without rashes exudates or  nodules.    Data Reviewed: Basic Metabolic Panel:  Recent Labs Lab 06/27/14 1620 06/28/14 0500  NA 141 140  K 4.5 4.3  CL 100 100  CO2 28 26  GLUCOSE 154* 168*  BUN 11 12  CREATININE 1.02 1.14  CALCIUM 9.6 8.9   Liver Function Tests:  Recent Labs Lab 06/27/14 1620 06/28/14 0500  AST 34 37  ALT 20 21  ALKPHOS 131* 103  BILITOT 0.2* 0.6  PROT 8.7* 7.6  ALBUMIN 4.1 3.7    Recent Labs Lab 06/27/14 1620  LIPASE 38   CBC:  Recent Labs Lab 06/27/14 1620 06/28/14 0500  WBC 11.6* 11.6*  NEUTROABS 6.0  --   HGB 13.8 12.8*  HCT 42.5 39.0  MCV 87.4 87.6  PLT 289 251   Cardiac Enzymes:  Recent Labs Lab 06/27/14 1822  TROPONINI <0.30   CBG:  Recent Labs Lab 06/29/14 1140 06/29/14 1656 06/29/14 2055 06/30/14 0802 06/30/14 1042  GLUCAP 223* 264* 221* 215* 193*    Studies: Ct Angio Chest Pe W/cm &/or Wo Cm 06/27/2014  Enlargement of the upper abdominal and retroperitoneal lymphadenopathy. Findings are consistent with metastatic disease and, reportedly, patient has a history of liver cancer. There is a poorly defined lesion near the hepatic dome which could represent a liver neoplasm. There is evidence for cirrhosis.  No evidence for abdominal ascites or free fluid.  Minimal left hydronephrosis with a stable 6 mm stone in the mid left ureter.  Limited evaluation for pulmonary emboli but no evidence for a large or central pulmonary embolism.  Areas of volume loss in the lungs without focal airspace disease.  Ct Abdomen Pelvis W Contrast 06/27/2014 Enlargement of the upper abdominal and retroperitoneal lymphadenopathy. Findings are consistent with metastatic disease and, reportedly, patient has a history of liver cancer. There is a poorly defined lesion near the hepatic dome which could represent a liver neoplasm. There is evidence for cirrhosis.  No evidence for abdominal ascites or free fluid.  Minimal left hydronephrosis with a stable 6 mm stone in the mid left  ureter.  Limited evaluation for pulmonary emboli but no evidence for a large or central pulmonary embolism.  Areas of volume loss in the lungs without focal airspace disease   Scheduled Meds: . cefTRIAXone (ROCEPHIN)  IV  1 g Intravenous Q24H  . docusate sodium  100 mg Oral BID  . enoxaparin (LOVENOX) injection  40 mg Subcutaneous Daily  . feeding supplement (ENSURE COMPLETE)  237 mL Oral BID BM  . insulin aspart  0-5 Units Subcutaneous QHS  . insulin aspart  0-9 Units Subcutaneous TID WC  . insulin glargine  30 Units Subcutaneous QHS  . pantoprazole  40 mg Oral BID AC  . polyethylene glycol  17 g Oral Daily  . tamsulosin  0.4 mg Oral Daily   Continuous Infusions:   Principal Problem:   Abdominal  pain Active Problems:   Hepatocellular carcinoma   Renal stone   Hydronephrosis   Diabetes mellitus   Anxiety state   Essential hypertension Fever    Ladaysha Soutar A, MD [facility] Pager [phone]. If 7PM-7AM, please contact night-coverage at [email or web address], password [facility] 06/30/2014, 11:34 AM  LOS: 3 days

## 2014-06-30 NOTE — Anesthesia Postprocedure Evaluation (Signed)
  Anesthesia Post-op Note  Patient: Stuart Moore  Procedure(s) Performed: Procedure(s) (LRB): CYSTOSCOPY WITH LEFT URETERAL STENT PLACEMENT (Left)  Patient Location: PACU  Anesthesia Type: General  Level of Consciousness: awake and alert   Airway and Oxygen Therapy: Patient Spontanous Breathing  Post-op Pain: mild  Post-op Assessment: Post-op Vital signs reviewed, Patient's Cardiovascular Status Stable, Respiratory Function Stable, Patent Airway and No signs of Nausea or vomiting  Last Vitals:  Filed Vitals:   06/30/14 1140  BP: 121/67  Pulse: 82  Temp: 36.3 C  Resp: 20    Post-op Vital Signs: stable   Complications: No apparent anesthesia complications

## 2014-06-30 NOTE — Progress Notes (Signed)
Patient ID: Stuart Moore, male   DOB: 1956/06/16, 58 y.o.   MRN: [id number]  Day of Surgery Subjective: Pt has remained afebrile overnight.  Abdominal pain decreased.  Still no flank pain.  Objective: Vital signs in last 24 hours: Temp:  [98.4 F (36.9 C)-101.9 F (38.8 C)] 99.3 F (37.4 C) (11/07 0652) Pulse Rate:  [92-111] 92 (11/07 0652) Resp:  [18-22] 20 (11/07 0652) BP: (136-156)/(73-88) 139/73 mmHg (11/07 0652) SpO2:  [91 %-95 %] 93 % (11/07 0652) Weight:  [83.8 kg (184 lb 11.9 oz)] 83.8 kg (184 lb 11.9 oz) (11/06 1744)  Intake/Output from previous day: 11/06 0701 - 11/07 0700 In: 460 [P.O.:410; IV Piggyback:50] Out: 275 [Urine:275] Intake/Output this shift:    Physical Exam:  General: Alert and oriented Abdomen: Soft, ND, No CVAT  Lab Results:  Recent Labs  06/27/14 1620 06/28/14 0500  HGB 13.8 12.8*  HCT 42.5 39.0   BMET  Recent Labs  06/27/14 1620 06/28/14 0500  NA 141 140  K 4.5 4.3  CL 100 100  CO2 28 26  GLUCOSE 154* 168*  BUN 11 12  CREATININE 1.02 1.14  CALCIUM 9.6 8.9     Studies/Results: No results found.  Assessment/Plan: Left ureteral stone:  Considering recent fever with unknown source, will place left ureteral stent to exclude ureteral obstruction as a complicating factor although my suspicion that he has a GU source is very low.  Will attempt to get left renal pelvic cultures to exclude infected urine above level of stone.   LOS: 3 days   Stuart Moore,LES 06/30/2014, 8:24 AM

## 2014-06-30 NOTE — Transfer of Care (Signed)
Immediate Anesthesia Transfer of Care Note  Patient: Stuart Moore  Procedure(s) Performed: Procedure(s): CYSTOSCOPY WITH LEFT URETERAL STENT PLACEMENT (Left)  Patient Location: PACU  Anesthesia Type:General  Level of Consciousness: awake, alert , oriented and patient cooperative  Airway & Oxygen Therapy: Patient Spontanous Breathing and Patient connected to face mask oxygen  Post-op Assessment: Report given to PACU RN, Post -op Vital signs reviewed and stable and Patient moving all extremities X 4  Post vital signs: stable  Complications: No apparent anesthesia complications

## 2014-06-30 NOTE — Op Note (Signed)
Preoperative diagnosis:  1. Left ureteral stone 2. Fever   Postoperative diagnosis:  1. Left ureteral stone  2. Fever  Procedure:  1. Cystoscopy 2. Left ureteral stent placement (6 x 24)  Surgeon: Roxy Horseman, Brooke Bonito. M.D.  Anesthesia: General  Complications: None  EBL: Minimal  Specimens: None  Indication: Stuart Moore is a 58 y.o. patient with a left ureteral stone and fever of unknown etiology.  Although there is no clear GU source for his infection, in the absence of another cause, it is necessary to relieve his ureteral obstruction in case it may be a possible source. After reviewing the management options for treatment, he elected to proceed with the above surgical procedure(s). We have discussed the potential benefits and risks of the procedure, side effects of the proposed treatment, the likelihood of the patient achieving the goals of the procedure, and any potential problems that might occur during the procedure or recuperation. Informed consent has been obtained.  Description of procedure:  The patient was taken to the operating room and general anesthesia was induced.  The patient was placed in the dorsal lithotomy position, prepped and draped in the usual sterile fashion, and preoperative antibiotics were administered. A preoperative time-out was performed.   Cystourethroscopy was performed.  The patient's urethra was examined and was normal. The bladder was then systematically examined in its entirety. There was no evidence for any bladder tumors, stones, or other mucosal pathology.    A 0.38 sensor guidewire was then advanced up the left ureter into the renal pelvis under fluoroscopic guidance.  The wire was then backloaded through the cystoscope and a ureteral stent was advance over the wire using Seldinger technique.  The stent was positioned appropriately under fluoroscopic and cystoscopic guidance.  The wire was then removed with an adequate stent curl noted  in the renal pelvis as well as in the bladder.  The bladder was then emptied and the procedure ended.  The patient appeared to tolerate the procedure well and without complications.  The patient was able to be awakened and transferred to the recovery unit in satisfactory condition.    Pryor Curia MD

## 2014-07-01 ENCOUNTER — Inpatient Hospital Stay (HOSPITAL_COMMUNITY): Payer: Non-veteran care

## 2014-07-01 DIAGNOSIS — D72829 Elevated white blood cell count, unspecified: Secondary | ICD-10-CM | POA: Clinically undetermined

## 2014-07-01 LAB — URINALYSIS, ROUTINE W REFLEX MICROSCOPIC
Bilirubin Urine: NEGATIVE
Glucose, UA: >1000 mg/dL — AB
KETONES UR: 15 mg/dL — AB
NITRITE: NEGATIVE
PROTEIN: 30 mg/dL — AB
Specific Gravity, Urine: 1.025 (ref 1.005–1.030)
Urobilinogen, UA: 0.2 mg/dL (ref 0.0–1.0)
pH: 6 (ref 5.0–8.0)

## 2014-07-01 LAB — GLUCOSE, CAPILLARY
GLUCOSE-CAPILLARY: 186 mg/dL — AB (ref 70–99)
GLUCOSE-CAPILLARY: 166 mg/dL — AB (ref 70–99)
Glucose-Capillary: 258 mg/dL — ABNORMAL HIGH (ref 70–99)
Glucose-Capillary: 234 mg/dL — ABNORMAL HIGH (ref 70–99)

## 2014-07-01 LAB — URINE MICROSCOPIC-ADD ON

## 2014-07-01 LAB — CBC
HEMATOCRIT: 37.2 % — AB (ref 39.0–52.0)
HEMOGLOBIN: 12 g/dL — AB (ref 13.0–17.0)
MCH: 27.6 pg (ref 26.0–34.0)
MCHC: 32.3 g/dL (ref 30.0–36.0)
MCV: 85.5 fL (ref 78.0–100.0)
Platelets: 303 10*3/uL (ref 150–400)
RBC: 4.35 MIL/uL (ref 4.22–5.81)
RDW: 13.3 % (ref 11.5–15.5)
WBC: 16.4 10*3/uL — ABNORMAL HIGH (ref 4.0–10.5)

## 2014-07-01 LAB — BASIC METABOLIC PANEL
Anion gap: 14 (ref 5–15)
BUN: 12 mg/dL (ref 6–23)
CALCIUM: 9 mg/dL (ref 8.4–10.5)
CHLORIDE: 97 meq/L (ref 96–112)
CO2: 24 mEq/L (ref 19–32)
CREATININE: 0.86 mg/dL (ref 0.50–1.35)
GFR calc non Af Amer: >90 mL/min (ref >90)
Glucose, Bld: 179 mg/dL — ABNORMAL HIGH (ref 70–99)
Potassium: 4.3 mEq/L (ref 3.7–5.3)
Sodium: 135 mEq/L — ABNORMAL LOW (ref 137–147)

## 2014-07-01 MED ORDER — INSULIN GLARGINE 100 UNIT/ML ~~LOC~~ SOLN
34.0000 [IU] | Freq: Every day | SUBCUTANEOUS | Status: DC
Start: 2014-07-01 — End: 2014-07-02
  Administered 2014-07-01: 34 [IU] via SUBCUTANEOUS
  Filled 2014-07-01 (×3): qty 0.34

## 2014-07-01 NOTE — Progress Notes (Signed)
, [facility] PROGRESS NOTE  JR MILLIRON UMP:[id number] DOB: 23-May-1956 DOA: 06/27/2014 PCP: [place]  Assessment/Plan: #1abdominal pain Likely secondary to metastatic cancer in conjunction with possible constipation. Clinical improvement with bowel movements and adjustment of pain regimen.continue oxycodone 10 mg as needed, morphine as needed, PPI.  #2 fever/leukocytosis Questionable etiology. Afebrile 24 hours. May be secondary to malignancy. CT abdomen and pelvis negative pain knee intra-abdominal pathology other than findings related to cancer. Patient does have a leukocytosis today and a such will repeat chest x-ray. Repeat UA with cultures and sensitivities. Check blood cultures 2. Continue empiric IV Rocephin. Patient status post cystoscopy with left ureteral stent placement per Dr. Alinda Money. Follow.  #3 dysuria Initial urinalysis done was negative. Will repeat UA.patient is status post cystoscopy with stent placement. Continue empiric IV Rocephin. Repeat blood cultures. Follow.  #4 hepatocellular carcinoma Diagnosis 6 months ago. Patient is to receive chemotherapy at the [place] in [place]. Continue pain management.  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  #6 nephrolithiasis Patient with a 6 mm renal stone with mild hydronephrosis unchanged from 8 2015. Patient is status post cystoscopy with stent placement. Per urology.  #7 diabetes mellitus CBGs have ranged from 165-258. Increase Lantus to 34 units daily. Continue sliding scale insulin.  #8 constipation Patient with multiple bowel movements overnight. Continue current bowel regimen.  #9 anxiety Alprazolam as needed.  #10 prophylaxis PPI for GI prophylaxis. Lovenox for DVT prophylaxis.  Code Status: full Family Communication: updated patient and wife at bedside. Disposition Plan: home when medically stable and leukocytosis  improved   Consultants:  Neurology: Dr. Alinda Money 06/29/2014  Procedures:  CT abdomen and pelvis 06/27/2014  CT angio chest 06/27/2014  Cystoscopy left ureteral stent placement 06/30/2014 Dr. Alinda Money  Antibiotics:  IV Rocephin 06/29/2014  HPI/Subjective: Patient complaining of some dysuria. Patient states abdominal pain has improved.  Objective: Filed Vitals:   07/01/14 0727  BP: 153/84  Pulse: 89  Temp: 97.6 F (36.4 C)  Resp: 20    Intake/Output Summary (Last 24 hours) at 07/01/14 1002 Last data filed at 07/01/14 0100  Gross per 24 hour  Intake   1110 ml  Output    451 ml  Net    659 ml   Filed Weights   06/29/14 0558 06/29/14 1744 07/01/14 0727  Weight: 85.3 kg (188 lb 0.8 oz) 83.8 kg (184 lb 11.9 oz) 82.6 kg (182 lb 1.6 oz)    Exam:   General:  NAD  Cardiovascular: RRR  Respiratory: RIGHT basilar crackles o/w clear  Abdomen: soft, nontender, nondistended, positive bowel sounds.  Musculoskeletal: no clubbing cyanosis or edema  Data Reviewed: Basic Metabolic Panel:  Recent Labs Lab 06/27/14 1620 06/28/14 0500 07/01/14 0450  NA 141 140 135*  K 4.5 4.3 4.3  CL 100 100 97  CO2 28 26 24   GLUCOSE 154* 168* 179*  BUN 11 12 12   CREATININE 1.02 1.14 0.86  CALCIUM 9.6 8.9 9.0   Liver Function Tests:  Recent Labs Lab 06/27/14 1620 06/28/14 0500  AST 34 37  ALT 20 21  ALKPHOS 131* 103  BILITOT 0.2* 0.6  PROT 8.7* 7.6  ALBUMIN 4.1 3.7    Recent Labs Lab 06/27/14 1620  LIPASE 38   No results for input(s): AMMONIA in the last 168 hours. CBC:  Recent Labs Lab 06/27/14 1620 06/28/14 0500 07/01/14 0450  WBC 11.6* 11.6* 16.4*  NEUTROABS 6.0  --   --   HGB 13.8  12.8* 12.0*  HCT 42.5 39.0 37.2*  MCV 87.4 87.6 85.5  PLT 289 251 303   Cardiac Enzymes:  Recent Labs Lab 06/27/14 1822  TROPONINI <0.30   BNP (last 3 results) No results for input(s): PROBNP in the last 8760 hours. CBG:  Recent Labs Lab 06/30/14 1042  06/30/14 1147 06/30/14 1658 06/30/14 2151 07/01/14 0809  GLUCAP 193* 185* 241* 165* 186*    Recent Results (from the past 240 hour(s))  Culture, Urine     Status: None   Collection Time: 06/28/14  9:50 AM  Result Value Ref Range Status   Specimen Description URINE, RANDOM  Final   Special Requests NONE  Final   Culture  Setup Time   Final    06/28/2014 15:37 Performed at [place] Performed at [facility]   Final   Culture NO GROWTH Performed at [facility]   Final   Report Status 06/29/2014 FINAL  Final  Surgical pcr screen     Status: None   Collection Time: 06/30/14  7:39 AM  Result Value Ref Range Status   MRSA, PCR NEGATIVE NEGATIVE Final   Staphylococcus aureus NEGATIVE NEGATIVE Final    Comment:        The Xpert SA Assay (FDA approved for NASAL specimens in patients over 59 years of age), is one component of a comprehensive surveillance program.  Test performance has been validated by [employer] for patients greater than or equal to 59 year old. It is not intended to diagnose infection nor to guide or monitor treatment.      Studies: Dg Chest 2 View  07/01/2014   CLINICAL DATA:  Fever and shortness of breath  EXAM: CHEST  2 VIEW  COMPARISON:  CT 06/27/2014, chest radiograph 11/18/2011  FINDINGS: Lung volumes are low with small bilateral pleural effusions and presumed bibasilar compressive atelectasis. Lung bases are obscured. No acute osseous finding.  IMPRESSION: Low lung volumes with small pleural effusions obscuring the lung bases, with compressive atelectasis or early pneumonia.   Electronically Signed   By: Conchita Paris M.D.   On: 07/01/2014 08:48    Scheduled Meds: . cefTRIAXone (ROCEPHIN)  IV  1 g Intravenous Q24H  . docusate sodium  100 mg Oral BID  . enoxaparin (LOVENOX) injection  40 mg Subcutaneous Daily  . feeding supplement (ENSURE COMPLETE)  237 mL Oral BID BM  . insulin aspart  0-5  Units Subcutaneous QHS  . insulin aspart  0-9 Units Subcutaneous TID WC  . insulin glargine  30 Units Subcutaneous QHS  . pantoprazole  40 mg Oral BID AC  . polyethylene glycol  17 g Oral Daily  . tamsulosin  0.4 mg Oral Daily   Continuous Infusions:   Principal Problem:   Abdominal pain Active Problems:   Hepatocellular carcinoma   Renal stone   Hydronephrosis   Diabetes mellitus   Anxiety state   Essential hypertension   Fever   Leukocytosis    Time spent: 19 minutes    THOMPSON,DANIEL M.D. [facility] Pager [phone]. If 7PM-7AM, please contact night-coverage at [email or web address], password [facility] 07/01/2014, 10:02 AM  LOS: 4 days

## 2014-07-01 NOTE — Progress Notes (Signed)
1 Day Post-Op Subjective: Patient reports no real clinical change status post stent placement. He was never having any significant left flank discomfort and most of his pain was epigastric. He continues to have some mild dysuria which is not surprising. No obvious problems or new symptoms attributable to his stent. He remains afebrile.  Objective: Vital signs in last 24 hours: Temp:  [97.4 F (36.3 C)-98.8 F (37.1 C)] 97.6 F (36.4 C) (11/08 0727) Pulse Rate:  [81-98] 89 (11/08 0727) Resp:  [17-30] 20 (11/08 0727) BP: (121-153)/(67-86) 153/84 mmHg (11/08 0727) SpO2:  [94 %-100 %] 95 % (11/08 0727) Weight:  [82.6 kg (182 lb 1.6 oz)] 82.6 kg (182 lb 1.6 oz) (11/08 0727)  Intake/Output from previous day: 11/07 0701 - 11/08 0700 In: 1160 [P.O.:360; I.V.:750; IV Piggyback:50] Out: 654 [Urine:650; Stool:1] Intake/Output this shift:    Physical Exam:  Constitutional: Vital signs reviewed. WD WN in NAD   Eyes: PERRL, No scleral icterus.   Cardiovascular: RRR Pulmonary/Chest: Normal effort Abdominal: Soft.some distention Extremities: No cyanosis or edema   Lab Results:  Recent Labs  07/01/14 0450  HGB 12.0*  HCT 37.2*   BMET  Recent Labs  07/01/14 0450  NA 135*  K 4.3  CL 97  CO2 24  GLUCOSE 179*  BUN 12  CREATININE 0.86  CALCIUM 9.0   No results for input(s): LABPT, INR in the last 72 hours. No results for input(s): LABURIN in the last 72 hours. Results for orders placed or performed during the hospital encounter of 06/27/14  Culture, Urine     Status: None   Collection Time: 06/28/14  9:50 AM  Result Value Ref Range Status   Specimen Description URINE, RANDOM  Final   Special Requests NONE  Final   Culture  Setup Time   Final    06/28/2014 15:37 Performed at [place] Performed at [facility]   Final   Culture NO GROWTH Performed at [facility]   Final   Report Status 06/29/2014 FINAL  Final   Surgical pcr screen     Status: None   Collection Time: 06/30/14  7:39 AM  Result Value Ref Range Status   MRSA, PCR NEGATIVE NEGATIVE Final   Staphylococcus aureus NEGATIVE NEGATIVE Final    Comment:        The Xpert SA Assay (FDA approved for NASAL specimens in patients over 32 years of age), is one component of a comprehensive surveillance program.  Test performance has been validated by [employer] for patients greater than or equal to 50 year old. It is not intended to diagnose infection nor to guide or monitor treatment.     Studies/Results: No results found.  Assessment/Plan:  Doing well urologically. Left nonobstructing ureteral stone now status post stent placement. Will require definitive management of the stone hopefully before starting chemotherapy in the next week or 2. Dr. Alinda Money will need to arrange definitive management. From a urologic perspective he can be discharged at this time.   LOS: 4 days   Shaquaya Wuellner S 07/01/2014, 8:50 AM

## 2014-07-01 NOTE — Plan of Care (Signed)
Problem: Phase II Progression Outcomes Goal: Vital signs remain stable, temperature < 100 Outcome: Completed/Met Date Met:  07/01/14 Goal: Tolerating diet Outcome: Completed/Met Date Met:  07/01/14 Goal: Voiding independently Outcome: Completed/Met Date Met:  07/01/14

## 2014-07-02 ENCOUNTER — Other Ambulatory Visit: Payer: Self-pay | Admitting: Urology

## 2014-07-02 ENCOUNTER — Encounter (HOSPITAL_COMMUNITY): Payer: Self-pay | Admitting: Urology

## 2014-07-02 DIAGNOSIS — E089 Diabetes mellitus due to underlying condition without complications: Secondary | ICD-10-CM | POA: Insufficient documentation

## 2014-07-02 LAB — CBC WITH DIFFERENTIAL/PLATELET
BASOS ABS: 0 10*3/uL (ref 0.0–0.1)
Basophils Relative: 0 % (ref 0–1)
Eosinophils Absolute: 0.2 10*3/uL (ref 0.0–0.7)
Eosinophils Relative: 2 % (ref 0–5)
HCT: 36.8 % — ABNORMAL LOW (ref 39.0–52.0)
Hemoglobin: 11.9 g/dL — ABNORMAL LOW (ref 13.0–17.0)
LYMPHS PCT: 22 % (ref 12–46)
Lymphs Abs: 3 10*3/uL (ref 0.7–4.0)
MCH: 28.2 pg (ref 26.0–34.0)
MCHC: 32.3 g/dL (ref 30.0–36.0)
MCV: 87.2 fL (ref 78.0–100.0)
Monocytes Absolute: 1.4 10*3/uL — ABNORMAL HIGH (ref 0.1–1.0)
Monocytes Relative: 11 % (ref 3–12)
NEUTROS ABS: 8.8 10*3/uL — AB (ref 1.7–7.7)
Neutrophils Relative %: 65 % (ref 43–77)
PLATELETS: 298 10*3/uL (ref 150–400)
RBC: 4.22 MIL/uL (ref 4.22–5.81)
RDW: 13.6 % (ref 11.5–15.5)
WBC: 13.5 10*3/uL — AB (ref 4.0–10.5)

## 2014-07-02 LAB — BASIC METABOLIC PANEL
Anion gap: 16 — ABNORMAL HIGH (ref 5–15)
BUN: 13 mg/dL (ref 6–23)
CALCIUM: 9 mg/dL (ref 8.4–10.5)
CHLORIDE: 95 meq/L — AB (ref 96–112)
CO2: 26 mEq/L (ref 19–32)
Creatinine, Ser: 0.86 mg/dL (ref 0.50–1.35)
GFR calc Af Amer: >90 mL/min (ref >90)
GFR calc non Af Amer: >90 mL/min (ref >90)
Glucose, Bld: 151 mg/dL — ABNORMAL HIGH (ref 70–99)
POTASSIUM: 3.6 meq/L — AB (ref 3.7–5.3)
Sodium: 137 mEq/L (ref 137–147)

## 2014-07-02 LAB — URINE CULTURE
Colony Count: NO GROWTH
Culture: NO GROWTH

## 2014-07-02 LAB — GLUCOSE, CAPILLARY
Glucose-Capillary: 184 mg/dL — ABNORMAL HIGH (ref 70–99)
Glucose-Capillary: 251 mg/dL — ABNORMAL HIGH (ref 70–99)

## 2014-07-02 MED ORDER — ALPRAZOLAM 0.25 MG PO TABS: 0.2500 mg | ORAL_TABLET | Freq: Two times a day (BID) | ORAL | Status: DC | PRN

## 2014-07-02 MED ORDER — POTASSIUM CHLORIDE CRYS ER 20 MEQ PO TBCR
40.0000 meq | EXTENDED_RELEASE_TABLET | Freq: Once | ORAL | Status: AC
  Administered 2014-07-02: 40 meq via ORAL
  Filled 2014-07-02: qty 2

## 2014-07-02 MED ORDER — OXYCODONE HCL 10 MG PO TABS: 10.0000 mg | ORAL_TABLET | Freq: Four times a day (QID) | ORAL | Status: DC | PRN

## 2014-07-02 MED ORDER — ENSURE COMPLETE PO LIQD: 237.0000 mL | Freq: Two times a day (BID) | ORAL | Status: DC

## 2014-07-02 MED ORDER — DSS 100 MG PO CAPS: 100.0000 mg | ORAL_CAPSULE | Freq: Two times a day (BID) | ORAL | Status: DC

## 2014-07-02 MED ORDER — OMEPRAZOLE 40 MG PO CPDR: 40.0000 mg | DELAYED_RELEASE_CAPSULE | Freq: Two times a day (BID) | ORAL | Status: DC

## 2014-07-02 MED ORDER — LEVOFLOXACIN 500 MG PO TABS: 500.0000 mg | ORAL_TABLET | Freq: Every day | ORAL | Status: AC

## 2014-07-02 MED ORDER — POLYETHYLENE GLYCOL 3350 17 G PO PACK: 17.0000 g | PACK | Freq: Every day | ORAL | Status: DC

## 2014-07-02 NOTE — Care Management Note (Signed)
    Page 1 of 1   07/02/2014     11:30:28 AM CARE MANAGEMENT NOTE 07/02/2014  Patient:  Stuart Moore, Stuart Moore   Account Number:  [id number]  Date Initiated:  07/02/2014  Documentation initiated by:  Dessa Phi  Subjective/Objective Assessment:   58 Y/O M ADMITTED W/ABD PAIN.     Action/Plan:   FROM HOME.[facility] W/S PCP-DR. LARZARDO.   Anticipated DC Date:  07/02/2014   Anticipated DC Plan:  [place]  CM consult      Choice offered to / List presented to:             Status of service:  Completed, signed off Medicare Important Message given?   (If response is "NO", the following Medicare IM given date fields will be blank) Date Medicare IM given:   Medicare IM given by:   Date Additional Medicare IM given:   Additional Medicare IM given by:    Discharge Disposition:  HOME/SELF CARE  Per UR Regulation:  Reviewed for med. necessity/level of care/duration of stay  If discussed at [place] of Stay Meetings, dates discussed:    Comments:  07/02/14 Kenya Kook RN,BSN NCM [phone] POD#2 L URETERAL STENT.NO D/C NEEDS OR ORDERS.[facility] TEL#[phone],LEFT VM INFORMING THAT PATIENT WAS ADMITTED, & FOR D/C TODAY.

## 2014-07-02 NOTE — Progress Notes (Signed)
Patient ID: Stuart Moore, male   DOB: [birth year], 58 y.o.   MRN: [id number]  2 Days Post-Op Subjective: Pt without abdominal pain. No fevers.  S/P left ureteral stent on Saturday.  Objective: Vital signs in last 24 hours: Temp:  [98.1 F (36.7 C)-99 F (37.2 C)] 99 F (37.2 C) (11/09 0445) Pulse Rate:  [89-98] 91 (11/09 0445) Resp:  [18-20] 20 (11/09 0445) BP: (99-141)/(63-88) 141/81 mmHg (11/09 0445) SpO2:  [94 %-96 %] 94 % (11/09 0445) Weight:  [82.01 kg (180 lb 12.8 oz)] 82.01 kg (180 lb 12.8 oz) (11/09 0445)  Intake/Output from previous day: 11/08 0701 - 11/09 0700 In: 650 [P.O.:600; IV Piggyback:50] Out: 200 [Urine:200] Intake/Output this shift:    Physical Exam:  General: Alert and oriented CV: RRR Lungs: Clear Abdomen: Soft, ND, No CVAT Ext: NT, No erythema  Lab Results:  Recent Labs  07/01/14 0450 07/02/14 0350  HGB 12.0* 11.9*  HCT 37.2* 36.8*   Lab Results  Component Value Date   WBC 13.5* 07/02/2014   HGB 11.9* 07/02/2014   HCT 36.8* 07/02/2014   MCV 87.2 07/02/2014   PLT 298 07/02/2014     BMET  Recent Labs  07/01/14 0450 07/02/14 0350  NA 135* 137  K 4.3 3.6*  CL 97 95*  CO2 24 26  GLUCOSE 179* 151*  BUN 12 13  CREATININE 0.86 0.86  CALCIUM 9.0 9.0   Urine culture was negative.  Assessment/Plan: Left ureteral stone s/p ureteral stent placement: His urine culture is negative and he is not having any further fever.  I do not suspect he had a GU source for his fever and I do not think he needs to continue antibiotics for this indication.  He will be scheduled for elective outpatient definitive stone treatment with left ureteroscopic laser lithotripsy next week prior to starting chemotherapy. I discussed the potential benefits and risks of the procedure, side effects of the proposed treatment, the likelihood of the patient achieving the goals of the procedure, and any potential problems that might occur during the procedure or  recuperation.    LOS: 5 days   Esvin Hnat,LES 07/02/2014, 10:37 AM

## 2014-07-02 NOTE — Discharge Summary (Signed)
Physician Discharge Summary  Stuart Moore:[id number] DOB: 05-Sep-1955 DOA: 06/27/2014  PCP: [place] date: 06/27/2014 Discharge date: 07/02/2014  Time spent: 70 minutes  Recommendations for Outpatient Follow-up:  1. Follow-up with Dr. Alinda Money of urology 1 week for definite stone treatment. 2. Follow-up with [facility] In 1-2 weeks. Patient's pain will need to be reassessed and medication adjusted. Patient's oxycodone was increased from 5-10 mg every 4-6 hours as needed for pain. Patient will follow-up at the [facility] also to begin chemotherapy.  Discharge Diagnoses:  Principal Problem:   Abdominal pain Active Problems:   Hepatocellular carcinoma   Renal stone   Hydronephrosis   Diabetes mellitus   Anxiety state   Essential hypertension   Fever   Leukocytosis   Liver cancer   Diabetes mellitus due to underlying condition without complications   Discharge Condition: stable and improved  Diet recommendation: carb modified diet  Filed Weights   06/29/14 1744 07/01/14 0727 07/02/14 0445  Weight: 83.8 kg (184 lb 11.9 oz) 82.6 kg (182 lb 1.6 oz) 82.01 kg (180 lb 12.8 oz)    History of present illness:  Stuart Moore is a 58 y.o. male with Past medical history of hepatocellular carcinoma scheduled for chemotherapy in this month, cirrhosis, essential hypertension, diabetes, hepatitis C, renal stone, anxiety. The patient presented with complaints of worsening of his chronic abdominal pain. He mentioned that he had chronic abdominal pain which is all over his abdomen but since the morning of admission he started having pain in his upper as well as lower abdomen which was sharp stabbing worsening with deep breathing and burning pain. He has been taking oxycodone at home and throughout the day the pain had progressively worsened making him have difficulty with eating as well as breathing and therefore he decided to come to the hospital. He  mentioned there was no recent change in his medication other than starting anxiety medication. He denied any diarrhea or constipation. He denied any burning with urination. He denied any nausea or vomiting. He complained of some acid reflux on and off. He denied any chest pain or shortness of breath.  The patient was coming from home. And at his baseline independent for most of his ADL  Hospital Course:  #1abdominal pain Likely secondary to metastatic cancer in conjunction with possible constipation. Patient was placed on a bowel regimen with good bowel movements. Patient's pain medication was adjusted and oxycodone increased to 10 mg every 4-6 hours as needed. Patient also had been placed empirically on IV Rocephin for possible UTI. Urine cultures came back negative. CT of the abdomen and pelvis was negative for any intra-abdominal pathology other than findings related to cancer. Patient improved clinically and will be discharged home in stable and improved condition on his increased dose of pain regimen. Patient also be discharged on a bowel regimen.  #2 fever/leukocytosis Questionable etiology. Patient had presented with fevers and noted to have a leukocytosis. Urinalysis which was done was negative for UTI. Urine cultures which were done came back negative. Blood cultures were obtained with no growth to date. Patient will placed initially empirically on IV Rocephin. It was felt patient's fever/leukocytosis could possibly be secondary to malignancy. CT abdomen and pelvis negative for intra-abdominal pathology other than findings related to cancer. repeat chest x-ray which was obtained was negative. Patient improved clinically remained afebrile for the rest of the hospitalization during patient was discharged on 3 more days of oral Levaquin  to complete a one-week course of antibiotic therapy. Patient status post cystoscopy with left ureteral stent placement per Dr. Alinda Money. Follow.  #3 dysuria Initial  urinalysis done was negative. Urine cultures were negative. Patient was empirically started on IV Rocephin. Urology consultation was obtained secondary to nephrolithiasis noted on CT scan. Patient subsequently underwent cystoscopy with stent placement. Blood cultures with no growth. Patient remained afebrile. Patient will follow-up with urology as outpatient. Patient be discharged on 3 more days of oral Levaquin to complete a seven-day course of antibiotic treatment.  #4 hepatocellular carcinoma Diagnosis 6 months ago. Patient is to receive chemotherapy at the [place] in [place]. Continue pain management. Follow-up with me as outpatient.  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 Remained stable.  #6 nephrolithiasis Patient with a 6 mm renal stone with mild hydronephrosis unchanged from 8/ 2015 as noted on CT abd/pelvis. ppatient was placed empirically on IV Rocephin. urinalysis obtained. Urine cultures were negative. Patient remained afebrile with a normal white count. Patient was seen by urology and underwent cystoscopy with stent placement. Patient will be scheduled to follow-up with urology as outpatient next week for elective outpatient definitive stone treatment with left ureteroscopic lithotripsy prior to starting his chemotherapy.Per urology.  #7 diabetes mellitus Patient placed on lantus and maintained on SSI. Outpatient follow up.  #8 constipation Patient presented with some complaints of constipation. Likely secondary to chronic narcotic pain medications. Enemas were tried and patient was placed on a bowel regimen of miralax with resolution of his constipation.   #9 anxiety Alprazolam as needed.  Procedures:  CT abdomen and pelvis 06/27/2014  CT angio chest 06/27/2014  Cystoscopy left ureteral stent placement 06/30/2014 Dr. Alinda Money  Consultations:  Urology: Dr. Alinda Money 06/29/2014  Discharge Exam: Filed Vitals:   07/02/14 0445  BP: 141/81   Pulse: 91  Temp: 99 F (37.2 C)  Resp: 20    General: NAD Cardiovascular: RRR Respiratory: CTAB  Discharge Instructions You were cared for by a hospitalist during your hospital stay. If you have any questions about your discharge medications or the care you received while you were in the hospital after you are discharged, you can call the unit and asked to speak with the hospitalist on call if the hospitalist that took care of you is not available. Once you are discharged, your primary care physician will handle any further medical issues. Please note that NO REFILLS for any discharge medications will be authorized once you are discharged, as it is imperative that you return to your primary care physician (or establish a relationship with a primary care physician if you do not have one) for your aftercare needs so that they can reassess your need for medications and monitor your lab values.  Discharge Instructions    Diet Carb Modified    Complete by:  As directed      Discharge instructions    Complete by:  As directed   Follow up with Dr Alinda Money as scheduled next week. Follow up with PCP at [facility] in 1-2 weeks.     Increase activity slowly    Complete by:  As directed           Current Discharge Medication List    START taking these medications   Details  ALPRAZolam (XANAX) 0.25 MG tablet Take 1 tablet (0.25 mg total) by mouth 2 (two) times daily as needed for anxiety. Qty: 20 tablet, Refills: 0    docusate sodium 100 MG CAPS Take 100 mg  by mouth 2 (two) times daily. Qty: 10 capsule, Refills: 0    feeding supplement, ENSURE COMPLETE, (ENSURE COMPLETE) LIQD Take 237 mLs by mouth 2 (two) times daily between meals.    levofloxacin (LEVAQUIN) 500 MG tablet Take 1 tablet (500 mg total) by mouth daily. TAKE FOR 3 DAYS THEN STOP. Qty: 3 tablet, Refills: 0    polyethylene glycol (MIRALAX / GLYCOLAX) packet Take 17 g by mouth daily. Qty: 30 each, Refills: 0      CONTINUE these  medications which have CHANGED   Details  omeprazole (PRILOSEC) 40 MG capsule Take 1 capsule (40 mg total) by mouth 2 (two) times daily before a meal. Qty: 60 capsule, Refills: 0    Oxycodone HCl 10 MG TABS Take 1 tablet (10 mg total) by mouth every 6 (six) hours as needed for severe pain. Qty: 30 tablet, Refills: 0      CONTINUE these medications which have NOT CHANGED   Details  insulin glargine (LANTUS) 100 UNIT/ML injection Inject 6-32 Units into the skin at bedtime. Sliding scale    lisinopril (PRINIVIL,ZESTRIL) 40 MG tablet Take 40 mg by mouth daily.    naproxen (NAPROSYN) 500 MG tablet Take 500 mg by mouth 2 (two) times daily with a meal.    propranolol (INDERAL) 20 MG tablet Take 20 mg by mouth 2 (two) times daily.    tamsulosin (FLOMAX) 0.4 MG CAPS capsule Take 1 capsule (0.4 mg total) by mouth daily. Qty: 7 capsule, Refills: 0    traZODone (DESYREL) 50 MG tablet Take 50 mg by mouth at bedtime as needed for sleep.    polyvinyl alcohol (ARTIFICIAL TEARS) 1.4 % ophthalmic solution Place 2 drops into the left eye as needed for dry eyes.        No Known Allergies Follow-up Information    Follow up with [facility]. Schedule an appointment as soon as possible for a visit in 1 week.   Specialty:  General Practice   Why:  f/u 1-2 weeks   Contact information:   [place] [address] [phone]       Follow up with BORDEN,LES, MD. Schedule an appointment as soon as possible for a visit in 1 week.   Specialty:  Urology   Contact information:   [place] [address] [phone]        The results of significant diagnostics from this hospitalization (including imaging, microbiology, ancillary and laboratory) are listed below for reference.    Significant Diagnostic Studies: Dg Chest 2 View  07/01/2014   CLINICAL DATA:  Fever and shortness of breath  EXAM: CHEST  2 VIEW  COMPARISON:  CT 06/27/2014, chest radiograph  11/18/2011  FINDINGS: Lung volumes are low with small bilateral pleural effusions and presumed bibasilar compressive atelectasis. Lung bases are obscured. No acute osseous finding.  IMPRESSION: Low lung volumes with small pleural effusions obscuring the lung bases, with compressive atelectasis or early pneumonia.   Electronically Signed   By: Conchita Paris M.D.   On: 07/01/2014 08:48   Ct Angio Chest Pe W/cm &/or Wo Cm  06/27/2014   CLINICAL DATA:  Increased abdominal pain for 3-4 days. History of liver cancer. Patient starting chemotherapy.  EXAM: CT ANGIOGRAPHY CHEST  CT ABDOMEN AND PELVIS WITH CONTRAST  TECHNIQUE: Multidetector CT imaging of the chest was performed using the standard protocol during bolus administration of intravenous contrast. Multiplanar CT image reconstructions and MIPs were obtained to evaluate the  vascular anatomy. Multidetector CT imaging of the abdomen and pelvis was performed using the standard protocol during bolus administration of intravenous contrast.  CONTRAST:  38mL OMNIPAQUE IOHEXOL 350 MG/ML SOLN  COMPARISON:  Abdominal CT 04/22/2014  FINDINGS: CTA CHEST FINDINGS  The contrast bolus is not optimal for evaluation of the pulmonary arteries but there is no evidence for large or central pulmonary embolism. The thoracic aorta is well opacified with contrast and there is no evidence for a dissection or aneurysm. The great vessels are patent. Mild dilatation of the esophagus with an air-fluid level. There is no significant chest lymphadenopathy. No significant pericardial or pleural fluid. Patchy densities in the lower lobes are most compatible with atelectasis. No significant airspace disease. No acute bone abnormality.  CT ABDOMEN and PELVIS FINDINGS  Negative for free intraperitoneal air. There is a poorly defined low-density lesion along the anterior dome of the liver. This area roughly measures 1.9 cm and may represent the known liver malignancy. Liver contour is mildly nodular  and suspect underlying cirrhosis. Normal appearance of the gallbladder. Normal appearance of the spleen and adrenal glands. Normal appearance of the pancreas. Again noted are markedly enlarged lymph nodes in the upper abdomen and porta hepatis region. Index lymph node in the precaval space measures 4.0 cm in the AP dimension and previously measured 3.5 cm. Large lymph node posterior to the gastric antrum measures 4.1 cm in AP dimension and previously measured 3.5 cm. Large lymph node in the porta hepatis is low-density and likely necrotic. This node measures 3.1 cm in the short axis and previously measured 2.7 cm. This large porta hepatis node causes some compression of the distal main portal vein but the portal venous system is patent. No significant dilatation of the stomach. Small lymph nodes in the gastrohepatic ligament region. Lymph node between the aorta and IVC measures 2.3 cm in short axis and previously measured 1.9 cm. There are additional retroperitoneal lymph nodes that demonstrate mild enlargement.  There is no significant lymphadenopathy in the pelvis. No evidence for abdominal ascites or free fluid. Normal appearance of the bladder and prostate. Normal appearance of the right kidney without hydronephrosis. Incidentally, the patient has a duplicated right renal collecting system. Again noted is mild fullness of the left renal pelvis. There is a persistent stone in the mid left ureter which measures 6 mm. Limited evaluation for kidney stones due to the post contrast images.  Normal appearance of the small bowel, large bowel and appendix.  Patient has an intramedullary rod in the proximal right femur. No acute bone abnormality.  Review of the MIP images confirms the above findings.  IMPRESSION: Enlargement of the upper abdominal and retroperitoneal lymphadenopathy. Findings are consistent with metastatic disease and, reportedly, patient has a history of liver cancer. There is a poorly defined lesion  near the hepatic dome which could represent a liver neoplasm. There is evidence for cirrhosis.  No evidence for abdominal ascites or free fluid.  Minimal left hydronephrosis with a stable 6 mm stone in the mid left ureter.  Limited evaluation for pulmonary emboli but no evidence for a large or central pulmonary embolism.  Areas of volume loss in the lungs without focal airspace disease.   Electronically Signed   By: Markus Daft M.D.   On: 06/27/2014 19:45   Ct Abdomen Pelvis W Contrast  06/27/2014   CLINICAL DATA:  Increased abdominal pain for 3-4 days. History of liver cancer. Patient starting chemotherapy.  EXAM: CT ANGIOGRAPHY CHEST  CT  ABDOMEN AND PELVIS WITH CONTRAST  TECHNIQUE: Multidetector CT imaging of the chest was performed using the standard protocol during bolus administration of intravenous contrast. Multiplanar CT image reconstructions and MIPs were obtained to evaluate the vascular anatomy. Multidetector CT imaging of the abdomen and pelvis was performed using the standard protocol during bolus administration of intravenous contrast.  CONTRAST:  24mL OMNIPAQUE IOHEXOL 350 MG/ML SOLN  COMPARISON:  Abdominal CT 04/22/2014  FINDINGS: CTA CHEST FINDINGS  The contrast bolus is not optimal for evaluation of the pulmonary arteries but there is no evidence for large or central pulmonary embolism. The thoracic aorta is well opacified with contrast and there is no evidence for a dissection or aneurysm. The great vessels are patent. Mild dilatation of the esophagus with an air-fluid level. There is no significant chest lymphadenopathy. No significant pericardial or pleural fluid. Patchy densities in the lower lobes are most compatible with atelectasis. No significant airspace disease. No acute bone abnormality.  CT ABDOMEN and PELVIS FINDINGS  Negative for free intraperitoneal air. There is a poorly defined low-density lesion along the anterior dome of the liver. This area roughly measures 1.9 cm and may  represent the known liver malignancy. Liver contour is mildly nodular and suspect underlying cirrhosis. Normal appearance of the gallbladder. Normal appearance of the spleen and adrenal glands. Normal appearance of the pancreas. Again noted are markedly enlarged lymph nodes in the upper abdomen and porta hepatis region. Index lymph node in the precaval space measures 4.0 cm in the AP dimension and previously measured 3.5 cm. Large lymph node posterior to the gastric antrum measures 4.1 cm in AP dimension and previously measured 3.5 cm. Large lymph node in the porta hepatis is low-density and likely necrotic. This node measures 3.1 cm in the short axis and previously measured 2.7 cm. This large porta hepatis node causes some compression of the distal main portal vein but the portal venous system is patent. No significant dilatation of the stomach. Small lymph nodes in the gastrohepatic ligament region. Lymph node between the aorta and IVC measures 2.3 cm in short axis and previously measured 1.9 cm. There are additional retroperitoneal lymph nodes that demonstrate mild enlargement.  There is no significant lymphadenopathy in the pelvis. No evidence for abdominal ascites or free fluid. Normal appearance of the bladder and prostate. Normal appearance of the right kidney without hydronephrosis. Incidentally, the patient has a duplicated right renal collecting system. Again noted is mild fullness of the left renal pelvis. There is a persistent stone in the mid left ureter which measures 6 mm. Limited evaluation for kidney stones due to the post contrast images.  Normal appearance of the small bowel, large bowel and appendix.  Patient has an intramedullary rod in the proximal right femur. No acute bone abnormality.  Review of the MIP images confirms the above findings.  IMPRESSION: Enlargement of the upper abdominal and retroperitoneal lymphadenopathy. Findings are consistent with metastatic disease and, reportedly,  patient has a history of liver cancer. There is a poorly defined lesion near the hepatic dome which could represent a liver neoplasm. There is evidence for cirrhosis.  No evidence for abdominal ascites or free fluid.  Minimal left hydronephrosis with a stable 6 mm stone in the mid left ureter.  Limited evaluation for pulmonary emboli but no evidence for a large or central pulmonary embolism.  Areas of volume loss in the lungs without focal airspace disease.   Electronically Signed   By: Markus Daft M.D.   On:  06/27/2014 19:45    Microbiology: Recent Results (from the past 240 hour(s))  Culture, Urine     Status: None   Collection Time: 06/28/14  9:50 AM  Result Value Ref Range Status   Specimen Description URINE, RANDOM  Final   Special Requests NONE  Final   Culture  Setup Time   Final    06/28/2014 15:37 Performed at [place] Performed at [facility]   Final   Culture NO GROWTH Performed at [facility]   Final   Report Status 06/29/2014 FINAL  Final  Culture, blood (routine x 2)     Status: None (Preliminary result)   Collection Time: 06/28/14  5:15 PM  Result Value Ref Range Status   Specimen Description BLOOD RIGHT HAND  Final   Special Requests BOTTLES DRAWN AEROBIC ONLY 3 CC  Final   Culture  Setup Time   Final    06/29/2014 00:59 Performed at [facility]    Culture   Final           BLOOD CULTURE RECEIVED NO GROWTH TO DATE CULTURE WILL BE HELD FOR 5 DAYS BEFORE ISSUING A FINAL NEGATIVE REPORT Performed at [facility]    Report Status PENDING  Incomplete  Culture, blood (routine x 2)     Status: None (Preliminary result)   Collection Time: 06/28/14  5:25 PM  Result Value Ref Range Status   Specimen Description BLOOD LEFT ARM  Final   Special Requests BOTTLES DRAWN AEROBIC ONLY 5 CC  Final   Culture  Setup Time   Final    06/29/2014 00:59 Performed at [facility]    Culture   Final            BLOOD CULTURE RECEIVED NO GROWTH TO DATE CULTURE WILL BE HELD FOR 5 DAYS BEFORE ISSUING A FINAL NEGATIVE REPORT Performed at [facility]    Report Status PENDING  Incomplete  Surgical pcr screen     Status: None   Collection Time: 06/30/14  7:39 AM  Result Value Ref Range Status   MRSA, PCR NEGATIVE NEGATIVE Final   Staphylococcus aureus NEGATIVE NEGATIVE Final    Comment:        The Xpert SA Assay (FDA approved for NASAL specimens in patients over 24 years of age), is one component of a comprehensive surveillance program.  Test performance has been validated by [employer] for patients greater than or equal to 49 year old. It is not intended to diagnose infection nor to guide or monitor treatment.   Culture, blood (routine x 2)     Status: None (Preliminary result)   Collection Time: 07/01/14 10:09 AM  Result Value Ref Range Status   Specimen Description BLOOD RIGHT ARM  Final   Special Requests BOTTLES DRAWN AEROBIC ONLY 2CC BLUE BOTTLE  Final   Culture  Setup Time   Final    07/01/2014 18:48 Performed at [facility]    Culture   Final           BLOOD CULTURE RECEIVED NO GROWTH TO DATE CULTURE WILL BE HELD FOR 5 DAYS BEFORE ISSUING A FINAL NEGATIVE REPORT Performed at [facility]    Report Status PENDING  Incomplete  Culture, blood (routine x 2)     Status: None (Preliminary result)   Collection Time: 07/01/14 10:12 AM  Result Value Ref Range Status   Specimen Description BLOOD LEFT  ARM  Final   Special Requests   Final    BOTTLES DRAWN AEROBIC AND ANAEROBIC 10CC BOTH BOTTLES   Culture  Setup Time   Final    07/01/2014 18:48 Performed at [facility]    Culture   Final           BLOOD CULTURE RECEIVED NO GROWTH TO DATE CULTURE WILL BE HELD FOR 5 DAYS BEFORE ISSUING A FINAL NEGATIVE REPORT Note: Culture results may be compromised due to an excessive volume of blood received in culture bottles. Performed at [facility]    Report Status PENDING  Incomplete     Labs: Basic Metabolic Panel:  Recent Labs Lab 06/27/14 1620 06/28/14 0500 07/01/14 0450 07/02/14 0350  NA 141 140 135* 137  K 4.5 4.3 4.3 3.6*  CL 100 100 97 95*  CO2 28 26 24 26   GLUCOSE 154* 168* 179* 151*  BUN 11 12 12 13   CREATININE 1.02 1.14 0.86 0.86  CALCIUM 9.6 8.9 9.0 9.0   Liver Function Tests:  Recent Labs Lab 06/27/14 1620 06/28/14 0500  AST 34 37  ALT 20 21  ALKPHOS 131* 103  BILITOT 0.2* 0.6  PROT 8.7* 7.6  ALBUMIN 4.1 3.7    Recent Labs Lab 06/27/14 1620  LIPASE 38   No results for input(s): AMMONIA in the last 168 hours. CBC:  Recent Labs Lab 06/27/14 1620 06/28/14 0500 07/01/14 0450 07/02/14 0350  WBC 11.6* 11.6* 16.4* 13.5*  NEUTROABS 6.0  --   --  8.8*  HGB 13.8 12.8* 12.0* 11.9*  HCT 42.5 39.0 37.2* 36.8*  MCV 87.4 87.6 85.5 87.2  PLT 289 251 303 298   Cardiac Enzymes:  Recent Labs Lab 06/27/14 1822  TROPONINI <0.30   BNP: BNP (last 3 results) No results for input(s): PROBNP in the last 8760 hours. CBG:  Recent Labs Lab 07/01/14 1225 07/01/14 1730 07/01/14 2150 07/02/14 0757 07/02/14 1202  GLUCAP 258* 166* 234* 184* 251*       Signed:  Jalani Cullifer MD [facility] 07/02/2014, 1:33 PM

## 2014-07-03 ENCOUNTER — Encounter (HOSPITAL_COMMUNITY): Payer: Self-pay | Admitting: *Deleted

## 2014-07-04 ENCOUNTER — Encounter (HOSPITAL_COMMUNITY): Payer: Self-pay | Admitting: *Deleted

## 2014-07-04 NOTE — Progress Notes (Signed)
Put in Cmet put instead of Bmet history of hepatitis C 2000 per anesthesia orders

## 2014-07-05 LAB — CULTURE, BLOOD (ROUTINE X 2)
Culture: NO GROWTH
Culture: NO GROWTH

## 2014-07-07 LAB — CULTURE, BLOOD (ROUTINE X 2)
CULTURE: NO GROWTH
Culture: NO GROWTH

## 2014-07-08 NOTE — Anesthesia Preprocedure Evaluation (Addendum)
Anesthesia Evaluation  Patient identified by MRN, date of birth, ID band Patient awake    Reviewed: Allergy & Precautions, H&P , NPO status , Patient's Chart, lab work & pertinent test results  History of Anesthesia Complications Negative for: history of anesthetic complications (patient denies any problems with anesthesia in the past)  Airway Mallampati: III  TM Distance: >3 FB Neck ROM: Full    Dental no notable dental hx. (+) Edentulous Upper, Edentulous Lower   Pulmonary neg sleep apnea (patient denies history of sleep apnea), former smoker,  breath sounds clear to auscultation  Pulmonary exam normal       Cardiovascular hypertension, Pt. on medications and Pt. on home beta blockers + Valvular Problems/Murmurs Rhythm:Regular Rate:Normal     Neuro/Psych  Headaches, PSYCHIATRIC DISORDERS Anxiety Depression    GI/Hepatic GERD-  Medicated and Controlled,(+) Cirrhosis -  ascites  substance abuse (hx of etoh and cocaine use, denies any use for several years)  alcohol use and cocaine use, Hepatitis -, CHx of hepatocellular carcinoma. Substance abuse not recent    Endo/Other  diabetes, Type 2, Insulin Dependent  Renal/GU Renal disease  negative genitourinary   Musculoskeletal negative musculoskeletal ROS (+)   Abdominal   Peds negative pediatric ROS (+)  Hematology negative hematology ROS (+)   Anesthesia Other Findings   Reproductive/Obstetrics negative OB ROS                            Anesthesia Physical Anesthesia Plan  ASA: III  Anesthesia Plan: General   Post-op Pain Management:    Induction:   Airway Management Planned: LMA  Additional Equipment:   Intra-op Plan:   Post-operative Plan:   Informed Consent:   Plan Discussed with: Surgeon  Anesthesia Plan Comments:         Anesthesia Quick Evaluation

## 2014-07-09 ENCOUNTER — Encounter (HOSPITAL_COMMUNITY)
Admission: RE | Disposition: A | Discharge status: Home / Self Care | Payer: Self-pay | Source: Ambulatory Visit | Attending: Urology

## 2014-07-09 ENCOUNTER — Ambulatory Visit (HOSPITAL_COMMUNITY): Payer: Self-pay

## 2014-07-09 ENCOUNTER — Ambulatory Visit (HOSPITAL_COMMUNITY): Payer: Non-veteran care | Admitting: Anesthesiology

## 2014-07-09 ENCOUNTER — Ambulatory Visit (HOSPITAL_COMMUNITY): Payer: Self-pay | Admitting: Anesthesiology

## 2014-07-09 ENCOUNTER — Encounter (HOSPITAL_COMMUNITY): Payer: Self-pay | Admitting: *Deleted

## 2014-07-09 ENCOUNTER — Ambulatory Visit (HOSPITAL_COMMUNITY)
Admission: RE | Admit: 2014-07-09 | Discharge: 2014-07-09 | Disposition: A | Discharge status: Home / Self Care | Payer: Self-pay | Source: Ambulatory Visit | Attending: Urology | Admitting: Urology

## 2014-07-09 DIAGNOSIS — R188 Other ascites: Secondary | ICD-10-CM | POA: Insufficient documentation

## 2014-07-09 DIAGNOSIS — I151 Hypertension secondary to other renal disorders: Secondary | ICD-10-CM

## 2014-07-09 DIAGNOSIS — K7460 Unspecified cirrhosis of liver: Secondary | ICD-10-CM | POA: Insufficient documentation

## 2014-07-09 DIAGNOSIS — F419 Anxiety disorder, unspecified: Secondary | ICD-10-CM | POA: Insufficient documentation

## 2014-07-09 DIAGNOSIS — E119 Type 2 diabetes mellitus without complications: Secondary | ICD-10-CM | POA: Insufficient documentation

## 2014-07-09 DIAGNOSIS — I10 Essential (primary) hypertension: Secondary | ICD-10-CM | POA: Insufficient documentation

## 2014-07-09 DIAGNOSIS — Z794 Long term (current) use of insulin: Secondary | ICD-10-CM | POA: Insufficient documentation

## 2014-07-09 DIAGNOSIS — N2889 Other specified disorders of kidney and ureter: Secondary | ICD-10-CM

## 2014-07-09 DIAGNOSIS — K219 Gastro-esophageal reflux disease without esophagitis: Secondary | ICD-10-CM | POA: Insufficient documentation

## 2014-07-09 DIAGNOSIS — C7989 Secondary malignant neoplasm of other specified sites: Secondary | ICD-10-CM | POA: Insufficient documentation

## 2014-07-09 DIAGNOSIS — N201 Calculus of ureter: Secondary | ICD-10-CM | POA: Insufficient documentation

## 2014-07-09 DIAGNOSIS — F329 Major depressive disorder, single episode, unspecified: Secondary | ICD-10-CM | POA: Insufficient documentation

## 2014-07-09 DIAGNOSIS — Z87891 Personal history of nicotine dependence: Secondary | ICD-10-CM | POA: Insufficient documentation

## 2014-07-09 DIAGNOSIS — C220 Liver cell carcinoma: Secondary | ICD-10-CM | POA: Insufficient documentation

## 2014-07-09 HISTORY — PX: HOLMIUM LASER APPLICATION: SHX5852

## 2014-07-09 HISTORY — PX: CYSTOSCOPY WITH RETROGRADE PYELOGRAM, URETEROSCOPY AND STENT PLACEMENT: SHX5789

## 2014-07-09 LAB — COMPREHENSIVE METABOLIC PANEL
ALK PHOS: 134 U/L — AB (ref 39–117)
ALT: 29 U/L (ref 0–53)
AST: 20 U/L (ref 0–37)
Albumin: 3.4 g/dL — ABNORMAL LOW (ref 3.5–5.2)
Anion gap: 14 (ref 5–15)
BUN: 8 mg/dL (ref 6–23)
CO2: 24 meq/L (ref 19–32)
Calcium: 9.4 mg/dL (ref 8.4–10.5)
Chloride: 102 mEq/L (ref 96–112)
Creatinine, Ser: 0.86 mg/dL (ref 0.50–1.35)
GLUCOSE: 183 mg/dL — AB (ref 70–99)
POTASSIUM: 4.3 meq/L (ref 3.7–5.3)
SODIUM: 140 meq/L (ref 137–147)
Total Bilirubin: 0.3 mg/dL (ref 0.3–1.2)
Total Protein: 8.2 g/dL (ref 6.0–8.3)

## 2014-07-09 LAB — GLUCOSE, CAPILLARY
Glucose-Capillary: 161 mg/dL — ABNORMAL HIGH (ref 70–99)
Glucose-Capillary: 177 mg/dL — ABNORMAL HIGH (ref 70–99)

## 2014-07-09 SURGERY — CYSTOURETEROSCOPY, WITH RETROGRADE PYELOGRAM AND STENT INSERTION
Anesthesia: General | Laterality: Left

## 2014-07-09 MED ORDER — LACTATED RINGERS IV SOLN
INTRAVENOUS | Status: DC | PRN
  Administered 2014-07-09: 07:00:00 via INTRAVENOUS

## 2014-07-09 MED ORDER — MIDAZOLAM HCL 5 MG/5ML IJ SOLN
INTRAMUSCULAR | Status: DC | PRN
  Administered 2014-07-09 (×2): 1 mg via INTRAVENOUS

## 2014-07-09 MED ORDER — PROPOFOL 10 MG/ML IV BOLUS
INTRAVENOUS | Status: AC
  Filled 2014-07-09: qty 20

## 2014-07-09 MED ORDER — SODIUM CHLORIDE 0.9 % IJ SOLN
INTRAMUSCULAR | Status: AC
  Filled 2014-07-09: qty 10

## 2014-07-09 MED ORDER — LACTATED RINGERS IV SOLN
INTRAVENOUS | Status: DC
Start: 2014-07-09 — End: 2014-07-09

## 2014-07-09 MED ORDER — FENTANYL CITRATE 0.05 MG/ML IJ SOLN
INTRAMUSCULAR | Status: AC
  Filled 2014-07-09: qty 2

## 2014-07-09 MED ORDER — ONDANSETRON HCL 4 MG/2ML IJ SOLN
INTRAMUSCULAR | Status: DC | PRN
  Administered 2014-07-09: 4 mg via INTRAVENOUS

## 2014-07-09 MED ORDER — LIDOCAINE HCL (CARDIAC) 20 MG/ML IV SOLN
INTRAVENOUS | Status: AC
  Filled 2014-07-09: qty 5

## 2014-07-09 MED ORDER — CIPROFLOXACIN IN D5W 400 MG/200ML IV SOLN
INTRAVENOUS | Status: AC
  Filled 2014-07-09: qty 200

## 2014-07-09 MED ORDER — LACTATED RINGERS IV SOLN: INTRAVENOUS | Status: DC

## 2014-07-09 MED ORDER — CIPROFLOXACIN IN D5W 400 MG/200ML IV SOLN
400.0000 mg | INTRAVENOUS | Status: AC
  Administered 2014-07-09: 400 mg via INTRAVENOUS

## 2014-07-09 MED ORDER — FENTANYL CITRATE 0.05 MG/ML IJ SOLN
INTRAMUSCULAR | Status: DC | PRN
  Administered 2014-07-09 (×2): 50 ug via INTRAVENOUS

## 2014-07-09 MED ORDER — PROPRANOLOL HCL 20 MG PO TABS
20.0000 mg | ORAL_TABLET | Freq: Once | ORAL | Status: AC
  Administered 2014-07-09: 20 mg via ORAL
  Filled 2014-07-09: qty 1

## 2014-07-09 MED ORDER — DEXAMETHASONE SODIUM PHOSPHATE 10 MG/ML IJ SOLN
INTRAMUSCULAR | Status: AC
  Filled 2014-07-09: qty 1

## 2014-07-09 MED ORDER — FENTANYL CITRATE 0.05 MG/ML IJ SOLN
25.0000 ug | INTRAMUSCULAR | Status: DC | PRN
  Administered 2014-07-09 (×2): 50 ug via INTRAVENOUS

## 2014-07-09 MED ORDER — EPHEDRINE SULFATE 50 MG/ML IJ SOLN
INTRAMUSCULAR | Status: AC
  Filled 2014-07-09: qty 1

## 2014-07-09 MED ORDER — PROPOFOL 10 MG/ML IV BOLUS
INTRAVENOUS | Status: DC | PRN
  Administered 2014-07-09: 80 mg via INTRAVENOUS

## 2014-07-09 MED ORDER — LIDOCAINE HCL 1 % IJ SOLN
INTRAMUSCULAR | Status: DC | PRN
  Administered 2014-07-09: 50 mg via INTRADERMAL

## 2014-07-09 MED ORDER — SODIUM CHLORIDE 0.9 % IR SOLN
Status: DC | PRN
Start: 2014-07-09 — End: 2014-07-09
  Administered 2014-07-09: 4000 mL via INTRAVESICAL

## 2014-07-09 MED ORDER — EPHEDRINE SULFATE 50 MG/ML IJ SOLN
INTRAMUSCULAR | Status: DC | PRN
  Administered 2014-07-09: 7.5 mg via INTRAVENOUS

## 2014-07-09 MED ORDER — MIDAZOLAM HCL 2 MG/2ML IJ SOLN
INTRAMUSCULAR | Status: AC
  Filled 2014-07-09: qty 2

## 2014-07-09 MED ORDER — HYDROCODONE-ACETAMINOPHEN 5-325 MG PO TABS
1.0000 | ORAL_TABLET | Freq: Once | ORAL | Status: AC | PRN
  Administered 2014-07-09: 1 via ORAL
  Filled 2014-07-09: qty 1

## 2014-07-09 SURGICAL SUPPLY — 19 items
BAG URO CATCHER STRL LF (DRAPE) ×3 IMPLANT
BASKET ZERO TIP NITINOL 2.4FR (BASKET) ×2 IMPLANT
BSKT STON RTRVL ZERO TP 2.4FR (BASKET) ×1
CATH INTERMIT  6FR 70CM (CATHETERS) ×2 IMPLANT
CLOTH BEACON ORANGE TIMEOUT ST (SAFETY) ×3 IMPLANT
DRAPE CAMERA CLOSED 9X96 (DRAPES) ×3 IMPLANT
FIBER LASER FLEXIVA 1000 (UROLOGICAL SUPPLIES) ×1 IMPLANT
FIBER LASER FLEXIVA 200 (UROLOGICAL SUPPLIES) ×1 IMPLANT
FIBER LASER FLEXIVA 365 (UROLOGICAL SUPPLIES) ×3 IMPLANT
FIBER LASER FLEXIVA 550 (UROLOGICAL SUPPLIES) ×1 IMPLANT
FIBER LASER TRAC TIP (UROLOGICAL SUPPLIES) ×1 IMPLANT
GLOVE BIOGEL M STRL SZ7.5 (GLOVE) ×7 IMPLANT
GOWN STRL REUS W/TWL LRG LVL3 (GOWN DISPOSABLE) ×6 IMPLANT
GUIDEWIRE ANG ZIPWIRE 038X150 (WIRE) IMPLANT
GUIDEWIRE STR DUAL SENSOR (WIRE) ×3 IMPLANT
MANIFOLD NEPTUNE II (INSTRUMENTS) ×3 IMPLANT
PACK CYSTO (CUSTOM PROCEDURE TRAY) ×3 IMPLANT
TUBING CONNECTING 10 (TUBING) ×2 IMPLANT
TUBING CONNECTING 10' (TUBING) ×1

## 2014-07-09 NOTE — Transfer of Care (Signed)
Immediate Anesthesia Transfer of Care Note  Patient: Stuart Moore  Procedure(s) Performed: Procedure(s): CYSTOSCOPY WITH, URETEROSCOPY AND STENT removal (Left) HOLMIUM LASER APPLICATION (Left)  Patient Location: PACU  Anesthesia Type:General  Level of Consciousness: awake, alert , oriented and patient cooperative  Airway & Oxygen Therapy: Patient Spontanous Breathing and Patient connected to face mask oxygen  Post-op Assessment: Report given to PACU RN, Post -op Vital signs reviewed and stable and Patient moving all extremities  Post vital signs: Reviewed and stable  Complications: No apparent anesthesia complications

## 2014-07-09 NOTE — Op Note (Signed)
Preoperative diagnosis: Left ureteral calculus  Postoperative diagnosis: Left ureteral calculus  Procedure:  1. Cystoscopy 2. Left ureteroscopy and stone removal 3. Ureteroscopic laser lithotripsy  Surgeon: Pryor Curia. M.D.  Anesthesia: General  Complications: None  EBL: Minimal  Specimens: 1. Left ureteral calculus  Disposition of specimens: [facility] for stone analysis  Indication: Stuart Moore is a 58 y.o. year old patient with urolithiasis who was noted to have a left ureteral stone and fever recently. He underwent ureteral stent placement and follows up today for definitive stone treatment. After reviewing the management options for treatment, the patient elected to proceed with the above surgical procedure(s). We have discussed the potential benefits and risks of the procedure, side effects of the proposed treatment, the likelihood of the patient achieving the goals of the procedure, and any potential problems that might occur during the procedure or recuperation. Informed consent has been obtained.  Description of procedure:  The patient was taken to the operating room and general anesthesia was induced.  The patient was placed in the dorsal lithotomy position, prepped and draped in the usual sterile fashion, and preoperative antibiotics were administered. A preoperative time-out was performed.   Cystourethroscopy was performed.  The patient's urethra was examined and was normal. The bladder was then systematically examined in its entirety. There was no evidence for any bladder tumors, stones, or other mucosal pathology.    Attention then turned to the left ureteral orifice.  The patient's indwelling left ureteral stent was identified and brought to the urethral meatus with flexible graspers.   A 0.38 sensor guidewire was then advanced up the left ureter through the stent and into the renal pelvis under fluoroscopic guidance. The 6 Fr  semirigid ureteroscope was then advanced into the ureter next to the guidewire and the calculus was identified.   The stone was then fragmented with the 365 micron holmium laser fiber on a setting of 0.6 J and frequency of 6 Hz.   All stones were then removed from the ureter with a zero tip nitinol basket.  Reinspection of the ureter revealed no remaining visible stones or fragments.   There was minimal edema and minimal trauma to the ureter during the procedure.  No stent was felt to be necessary.  The bladder was then emptied and the procedure ended.  The patient appeared to tolerate the procedure well and without complications.  The patient was able to be awakened and transferred to the recovery unit in satisfactory condition.

## 2014-07-09 NOTE — Discharge Instructions (Signed)
1. You may see some blood in the urine and may have some burning with urination for 48-72 hours. You also may notice that you have to urinate more frequently or urgently after your procedure which is normal.  °2. You should call should you develop an inability urinate, fever > 101, persistent nausea and vomiting that prevents you from eating or drinking to stay hydrated.  °
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
## 2014-07-09 NOTE — Anesthesia Postprocedure Evaluation (Signed)
  Anesthesia Post-op Note  Patient: Stuart Moore  Procedure(s) Performed: Procedure(s) (LRB): CYSTOSCOPY WITH, URETEROSCOPY AND STENT removal (Left) HOLMIUM LASER APPLICATION (Left)  Patient Location: PACU  Anesthesia Type: General  Level of Consciousness: awake and alert   Airway and Oxygen Therapy: Patient Spontanous Breathing  Post-op Pain: mild  Post-op Assessment: Post-op Vital signs reviewed, Patient's Cardiovascular Status Stable, Respiratory Function Stable, Patent Airway and No signs of Nausea or vomiting  Last Vitals:  Filed Vitals:   07/09/14 0922  BP: 109/70  Pulse: 64  Temp: 36.6 C  Resp: 20    Post-op Vital Signs: stable   Complications: No apparent anesthesia complications

## 2014-07-09 NOTE — Interval H&P Note (Signed)
History and Physical Interval Note:  07/09/2014 7:34 AM  Stuart Moore  has presented today for surgery, with the diagnosis of LEFT URETERAL STONE  The various methods of treatment have been discussed with the patient and family. After consideration of risks, benefits and other options for treatment, the patient has consented to  Procedure(s): CYSTOSCOPY WITH RETROGRADE PYELOGRAM, URETEROSCOPY AND STENT PLACEMENT (Left) HOLMIUM LASER APPLICATION (Left) as a surgical intervention .  The patient's history has been reviewed, patient examined, no change in status, stable for surgery.  I have reviewed the patient's chart and labs.  Questions were answered to the patient's satisfaction.     Teesha Ohm,LES

## 2014-07-10 ENCOUNTER — Encounter (HOSPITAL_COMMUNITY): Payer: Self-pay | Admitting: Urology

## 2014-11-20 ENCOUNTER — Encounter (HOSPITAL_COMMUNITY): Payer: Self-pay | Admitting: *Deleted

## 2014-11-20 ENCOUNTER — Emergency Department (HOSPITAL_COMMUNITY)
Admission: EM | Admit: 2014-11-20 | Discharge: 2014-11-20 | Disposition: A | Discharge status: Home / Self Care | Payer: Medicare Other | Attending: Emergency Medicine | Admitting: Emergency Medicine

## 2014-11-20 ENCOUNTER — Emergency Department (HOSPITAL_COMMUNITY): Payer: Medicare Other

## 2014-11-20 DIAGNOSIS — Z87442 Personal history of urinary calculi: Secondary | ICD-10-CM | POA: Insufficient documentation

## 2014-11-20 DIAGNOSIS — Z8719 Personal history of other diseases of the digestive system: Secondary | ICD-10-CM | POA: Diagnosis not present

## 2014-11-20 DIAGNOSIS — Z87891 Personal history of nicotine dependence: Secondary | ICD-10-CM | POA: Insufficient documentation

## 2014-11-20 DIAGNOSIS — C220 Liver cell carcinoma: Secondary | ICD-10-CM | POA: Diagnosis not present

## 2014-11-20 DIAGNOSIS — E119 Type 2 diabetes mellitus without complications: Secondary | ICD-10-CM | POA: Insufficient documentation

## 2014-11-20 DIAGNOSIS — R05 Cough: Secondary | ICD-10-CM

## 2014-11-20 DIAGNOSIS — Z87828 Personal history of other (healed) physical injury and trauma: Secondary | ICD-10-CM | POA: Insufficient documentation

## 2014-11-20 DIAGNOSIS — Z794 Long term (current) use of insulin: Secondary | ICD-10-CM | POA: Insufficient documentation

## 2014-11-20 DIAGNOSIS — I10 Essential (primary) hypertension: Secondary | ICD-10-CM | POA: Insufficient documentation

## 2014-11-20 DIAGNOSIS — R059 Cough, unspecified: Secondary | ICD-10-CM

## 2014-11-20 DIAGNOSIS — Z79899 Other long term (current) drug therapy: Secondary | ICD-10-CM | POA: Insufficient documentation

## 2014-11-20 DIAGNOSIS — R1013 Epigastric pain: Secondary | ICD-10-CM | POA: Insufficient documentation

## 2014-11-20 DIAGNOSIS — R079 Chest pain, unspecified: Secondary | ICD-10-CM | POA: Diagnosis present

## 2014-11-20 LAB — URINALYSIS, ROUTINE W REFLEX MICROSCOPIC
Bilirubin Urine: NEGATIVE
Glucose, UA: NEGATIVE mg/dL
Hgb urine dipstick: NEGATIVE
Ketones, ur: NEGATIVE mg/dL
Leukocytes, UA: NEGATIVE
Nitrite: NEGATIVE
Protein, ur: NEGATIVE mg/dL
Specific Gravity, Urine: 1.011 (ref 1.005–1.030)
Urobilinogen, UA: 0.2 mg/dL (ref 0.0–1.0)
pH: 7 (ref 5.0–8.0)

## 2014-11-20 LAB — COMPREHENSIVE METABOLIC PANEL
ALT: 23 U/L (ref 0–53)
AST: 29 U/L (ref 0–37)
Albumin: 4.3 g/dL (ref 3.5–5.2)
Alkaline Phosphatase: 114 U/L (ref 39–117)
Anion gap: 9 (ref 5–15)
BUN: 9 mg/dL (ref 6–23)
CO2: 25 mmol/L (ref 19–32)
Calcium: 8.6 mg/dL (ref 8.4–10.5)
Chloride: 101 mmol/L (ref 96–112)
Creatinine, Ser: 0.89 mg/dL (ref 0.50–1.35)
GFR calc Af Amer: >90 mL/min (ref >90)
GFR calc non Af Amer: >90 mL/min (ref >90)
Glucose, Bld: 174 mg/dL — ABNORMAL HIGH (ref 70–99)
Potassium: 3.9 mmol/L (ref 3.5–5.1)
Sodium: 135 mmol/L (ref 135–145)
Total Bilirubin: 0.6 mg/dL (ref 0.3–1.2)
Total Protein: 8 g/dL (ref 6.0–8.3)

## 2014-11-20 LAB — CBC WITH DIFFERENTIAL/PLATELET
Basophils Absolute: 0.1 10*3/uL (ref 0.0–0.1)
Basophils Relative: 1 % (ref 0–1)
Eosinophils Absolute: 0.2 10*3/uL (ref 0.0–0.7)
Eosinophils Relative: 3 % (ref 0–5)
HCT: 39.5 % (ref 39.0–52.0)
Hemoglobin: 12.9 g/dL — ABNORMAL LOW (ref 13.0–17.0)
Lymphocytes Relative: 36 % (ref 12–46)
Lymphs Abs: 2.9 10*3/uL (ref 0.7–4.0)
MCH: 29.3 pg (ref 26.0–34.0)
MCHC: 32.7 g/dL (ref 30.0–36.0)
MCV: 89.6 fL (ref 78.0–100.0)
Monocytes Absolute: 0.7 10*3/uL (ref 0.1–1.0)
Monocytes Relative: 9 % (ref 3–12)
Neutro Abs: 4.2 10*3/uL (ref 1.7–7.7)
Neutrophils Relative %: 51 % (ref 43–77)
Platelets: 232 10*3/uL (ref 150–400)
RBC: 4.41 MIL/uL (ref 4.22–5.81)
RDW: 14.2 % (ref 11.5–15.5)
WBC: 8.2 10*3/uL (ref 4.0–10.5)

## 2014-11-20 LAB — I-STAT CG4 LACTIC ACID, ED: Lactic Acid, Venous: 0.77 mmol/L (ref 0.5–2.0)

## 2014-11-20 LAB — LIPASE, BLOOD: Lipase: 36 U/L (ref 11–59)

## 2014-11-20 LAB — I-STAT TROPONIN, ED: Troponin i, poc: 0 ng/mL (ref 0.00–0.08)

## 2014-11-20 MED ORDER — HYDROMORPHONE HCL 2 MG/ML IJ SOLN
3.0000 mg | Freq: Once | INTRAMUSCULAR | Status: AC
  Administered 2014-11-20: 3 mg via INTRAVENOUS
  Filled 2014-11-20: qty 2

## 2014-11-20 NOTE — Discharge Instructions (Signed)
[facility] Address: [address], [place], [place] [address] [phone]    Abdominal Pain Many things can cause abdominal pain. Usually, abdominal pain is not caused by a disease and will improve without treatment. It can often be observed and treated at home. Your health care provider will do a physical exam and possibly order blood tests and X-rays to help determine the seriousness of your pain. However, in many cases, more time must pass before a clear cause of the pain can be found. Before that point, your health care provider may not know if you need more testing or further treatment. HOME CARE INSTRUCTIONS  Monitor your abdominal pain for any changes. The following actions may help to alleviate any discomfort you are experiencing:  Only take over-the-counter or prescription medicines as directed by your health care provider.  Do not take laxatives unless directed to do so by your health care provider.  Try a clear liquid diet (broth, tea, or water) as directed by your health care provider. Slowly move to a bland diet as tolerated. SEEK MEDICAL CARE IF:  You have unexplained abdominal pain.  You have abdominal pain associated with nausea or diarrhea.  You have pain when you urinate or have a bowel movement.  You experience abdominal pain that wakes you in the night.  You have abdominal pain that is worsened or improved by eating food.  You have abdominal pain that is worsened with eating fatty foods.  You have a fever. SEEK IMMEDIATE MEDICAL CARE IF:   Your pain does not go away within 2 hours.  You keep throwing up (vomiting).  Your pain is felt only in portions of the abdomen, such as the right side or the left lower portion of the abdomen.  You pass bloody or black tarry stools. MAKE SURE YOU:  Understand these instructions.   Will watch your condition.   Will get help right away if you are not doing well or get  worse.  Document Released: 05/20/2005 Document Revised: 08/15/2013 Document Reviewed: 04/19/2013 [facility] Patient Information 2015 [place], [place]. This information is not intended to replace advice given to you by your health care provider. Make sure you discuss any questions you have with your health care provider.

## 2014-11-20 NOTE — ED Notes (Signed)
Pt reports currently has stage IV liver cancer, takes daily chemo pills. Pt reports his [place] doctors said the chemo was not working and the tumor was growing, pt wants resources/referrals to [facility]. Pt reports abdominal pain x1 week, small cough x1 week. Chest pain started yesterday. Pain 5/10. Pt oxycodone at 0600. Denies dysuria.
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
## 2014-12-05 ENCOUNTER — Telehealth: Payer: Self-pay | Admitting: Oncology

## 2014-12-05 NOTE — Telephone Encounter (Signed)
NEW PATIENT APPT- S/W PATIENT AND GAVE NP APPT FOR 04/25 @ 1:30 W/DR. SHERRILL

## 2014-12-17 ENCOUNTER — Encounter: Payer: Self-pay | Admitting: Oncology

## 2014-12-17 ENCOUNTER — Ambulatory Visit (HOSPITAL_BASED_OUTPATIENT_CLINIC_OR_DEPARTMENT_OTHER): Payer: Medicare Other | Admitting: Oncology

## 2014-12-17 ENCOUNTER — Ambulatory Visit: Payer: Medicare Other

## 2014-12-17 ENCOUNTER — Other Ambulatory Visit (HOSPITAL_BASED_OUTPATIENT_CLINIC_OR_DEPARTMENT_OTHER): Payer: Medicare Other

## 2014-12-17 ENCOUNTER — Encounter: Payer: Self-pay | Admitting: *Deleted

## 2014-12-17 ENCOUNTER — Other Ambulatory Visit: Payer: Self-pay | Admitting: *Deleted

## 2014-12-17 ENCOUNTER — Telehealth: Payer: Self-pay | Admitting: Oncology

## 2014-12-17 VITALS — BP 134/79 | HR 81 | Temp 97.9°F | Resp 17 | Ht 68.0 in | Wt 177.7 lb

## 2014-12-17 DIAGNOSIS — Z800 Family history of malignant neoplasm of digestive organs: Secondary | ICD-10-CM

## 2014-12-17 DIAGNOSIS — H5441 Blindness, right eye, normal vision left eye: Secondary | ICD-10-CM

## 2014-12-17 DIAGNOSIS — C220 Liver cell carcinoma: Secondary | ICD-10-CM

## 2014-12-17 DIAGNOSIS — Z801 Family history of malignant neoplasm of trachea, bronchus and lung: Secondary | ICD-10-CM

## 2014-12-17 DIAGNOSIS — E119 Type 2 diabetes mellitus without complications: Secondary | ICD-10-CM

## 2014-12-17 DIAGNOSIS — C772 Secondary and unspecified malignant neoplasm of intra-abdominal lymph nodes: Secondary | ICD-10-CM | POA: Diagnosis not present

## 2014-12-17 DIAGNOSIS — G893 Neoplasm related pain (acute) (chronic): Secondary | ICD-10-CM | POA: Diagnosis not present

## 2014-12-17 LAB — COMPREHENSIVE METABOLIC PANEL (CC13)
ALK PHOS: 137 U/L (ref 40–150)
ALT: 30 U/L (ref 0–55)
ANION GAP: 14 meq/L — AB (ref 3–11)
AST: 21 U/L (ref 5–34)
Albumin: 3.7 g/dL (ref 3.5–5.0)
BUN: 9.2 mg/dL (ref 7.0–26.0)
CALCIUM: 9.9 mg/dL (ref 8.4–10.4)
CO2: 25 meq/L (ref 22–29)
Chloride: 100 mEq/L (ref 98–109)
Creatinine: 1 mg/dL (ref 0.7–1.3)
EGFR: >90 mL/min/{1.73_m2} (ref >90)
Glucose: 180 mg/dl — ABNORMAL HIGH (ref 70–140)
POTASSIUM: 4.4 meq/L (ref 3.5–5.1)
Sodium: 139 mEq/L (ref 136–145)
Total Bilirubin: 0.26 mg/dL (ref 0.20–1.20)
Total Protein: 8.3 g/dL (ref 6.4–8.3)

## 2014-12-17 LAB — CBC WITH DIFFERENTIAL/PLATELET
BASO%: 0.4 % (ref 0.0–2.0)
BASOS ABS: 0 10*3/uL (ref 0.0–0.1)
EOS%: 1.1 % (ref 0.0–7.0)
Eosinophils Absolute: 0.1 10*3/uL (ref 0.0–0.5)
HCT: 39.2 % (ref 38.4–49.9)
HGB: 12.7 g/dL — ABNORMAL LOW (ref 13.0–17.1)
LYMPH#: 3.1 10*3/uL (ref 0.9–3.3)
LYMPH%: 32.3 % (ref 14.0–49.0)
MCH: 28.7 pg (ref 27.2–33.4)
MCHC: 32.4 g/dL (ref 32.0–36.0)
MCV: 88.5 fL (ref 79.3–98.0)
MONO#: 0.7 10*3/uL (ref 0.1–0.9)
MONO%: 7.5 % (ref 0.0–14.0)
NEUT#: 5.7 10*3/uL (ref 1.5–6.5)
NEUT%: 58.7 % (ref 39.0–75.0)
Platelets: 420 10*3/uL — ABNORMAL HIGH (ref 140–400)
RBC: 4.43 10*6/uL (ref 4.20–5.82)
RDW: 13.7 % (ref 11.0–14.6)
WBC: 9.7 10*3/uL (ref 4.0–10.3)

## 2014-12-17 MED ORDER — OXYCODONE HCL 10 MG PO TABS: ORAL_TABLET | ORAL | Status: DC

## 2014-12-17 NOTE — Telephone Encounter (Signed)
Pt confirmed labs/ov per 04/25 POF, gave pt AVS and Calendar.... KJ, gave pt barium

## 2014-12-17 NOTE — Progress Notes (Signed)
Per Dr. Benay Spice request, will add patient to GI Tumor board discussion on 12/26/14.

## 2014-12-17 NOTE — [facility] Oncology Navigator Note (Signed)
Met with patient and wife during new patient visit. Explained the role of the GI Nurse Navigator and provided New Patient Packet with information on: 1.  Liver cancer 2. Support groups 3. Advanced Directives 4. Fall Safety Plan Answered questions, reviewed current treatment plan using TEACH back and provided emotional support. Provided copy of current treatment plan. Had patient sign release to obtain records and CD of radiology studies from [facility] and gave to HIM department. Will also order Education officer, museum and nutrition referral due to 10 lb weight loss and not having prescription insurance. Financial advocate is aware and informed patient to bring in his bank statements as proof of income so he may qualify for grant when he starts treatment.  Merceda Elks, RN, BSN GI Oncology [place]
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
## 2014-12-17 NOTE — Progress Notes (Signed)
Checked in new pt with no financial concerns prior to seeing the dr.  Pt has my card for any billing questions or concerns. ° °

## 2014-12-18 LAB — AFP TUMOR MARKER: AFP TUMOR MARKER: 630.2 ng/mL — AB (ref <6.1)

## 2014-12-20 ENCOUNTER — Ambulatory Visit (HOSPITAL_COMMUNITY)
Admission: RE | Admit: 2014-12-20 | Discharge: 2014-12-20 | Disposition: A | Discharge status: Home / Self Care | Payer: Medicare Other | Source: Ambulatory Visit | Attending: Oncology | Admitting: Oncology

## 2014-12-20 ENCOUNTER — Encounter (HOSPITAL_COMMUNITY): Payer: Self-pay

## 2014-12-20 DIAGNOSIS — C220 Liver cell carcinoma: Secondary | ICD-10-CM | POA: Diagnosis present

## 2014-12-20 DIAGNOSIS — R221 Localized swelling, mass and lump, neck: Secondary | ICD-10-CM | POA: Insufficient documentation

## 2014-12-20 MED ORDER — IOHEXOL 300 MG/ML  SOLN
100.0000 mL | Freq: Once | INTRAMUSCULAR | Status: AC | PRN
  Administered 2014-12-20: 100 mL via INTRAVENOUS

## 2014-12-27 ENCOUNTER — Telehealth: Payer: Self-pay | Admitting: Oncology

## 2014-12-27 ENCOUNTER — Ambulatory Visit (HOSPITAL_BASED_OUTPATIENT_CLINIC_OR_DEPARTMENT_OTHER): Payer: Medicare Other | Admitting: Oncology

## 2014-12-27 VITALS — BP 124/71 | HR 66 | Temp 98.1°F | Resp 18 | Ht 68.0 in | Wt 177.6 lb

## 2014-12-27 DIAGNOSIS — R590 Localized enlarged lymph nodes: Secondary | ICD-10-CM

## 2014-12-27 DIAGNOSIS — C772 Secondary and unspecified malignant neoplasm of intra-abdominal lymph nodes: Secondary | ICD-10-CM

## 2014-12-27 DIAGNOSIS — C220 Liver cell carcinoma: Secondary | ICD-10-CM | POA: Diagnosis present

## 2014-12-27 MED ORDER — OXYCODONE HCL 10 MG PO TABS: ORAL_TABLET | ORAL | Status: DC

## 2014-12-27 NOTE — Progress Notes (Signed)
  [facility] OFFICE PROGRESS NOTE   Diagnosis: Hepatocellular carcinoma  INTERVAL HISTORY:   Stuart Moore returns as scheduled. He continues to have abdominal pain. He was seen at the [facility] yesterday and prescribed MS Contin. He is taking oxycodone for breakthrough pain. He reports good control of his pain with this regimen. He is having bowel movements.  Objective:  Vital signs in last 24 hours:  Blood pressure 124/71, pulse 66, temperature 98.1 F (36.7 C), temperature source Oral, resp. rate 18, height 5\' 8"  (1.727 m), weight 177 lb 9.6 oz (80.559 kg), SpO2 99 %.    HEENT: Left supraclavicular mass Resp: Lungs clear bilaterally Cardio: Regular rate and rhythm GI: Palpable liver edge in the right lateral abdomen, tender mass at the mid upper abdomen, no splenomegaly Vascular: No leg edema   Lab Results:  Lab Results  Component Value Date   WBC 9.7 12/17/2014   HGB 12.7* 12/17/2014   HCT 39.2 12/17/2014   MCV 88.5 12/17/2014   PLT 420* 12/17/2014   NEUTROABS 5.7 12/17/2014   AFP-630   Imaging:  CTs of the neck, chest, abdomen and pelvis on 12/20/2014-progressive lymphadenopathy in the left supraclavicular region and upper abdomen stable low attenuation structure in segment 8 of the liver, small nonspecific hyperenhancing lesion in segment 7-unchanged.  I reviewed the CT images with Stuart Moore. Medications: I have reviewed the patient's current medications.  Assessment/Plan: 1. Hepatocellular carcinoma  Status post microwave ablation of a liver lesion I 7 2015  Biopsy of a periportal lymph node 06/07/2014 confirmed metastatic hepatocellular carcinoma  Sorafenib started November 2015  CT 12/20/2014 with an enlarged left supraclavicular node, progressive bulky upper abdominal lymphadenopathy, stable apparent ablation defect in segment 8 of the liver, stable hyperenhancing and in segment 7 of the liver   2. Hepatitis C/cirrhosis-treated  successfully for hepatitis C infection at the [facility]  3. Diabetes  4. Pain secondary to #1  5. Right eye blindness   Disposition:  Stuart Moore has better pain control with the current narcotic regimen. He will decrease the MS Contin to once daily if he becomes drowsy. The pain appears to be related to metastatic lymphadenopathy in the upper abdomen.  I presented his case at the GI tumor conference on 12/25/2014. The consensus opinion is to proceed with a biopsy of the left supraclavicular lymph node to confirm a diagnosis of metastatic hepatocellular carcinoma. He will be referred to radiation oncology to consider palliative radiation to the painful upper abdominal lymphadenopathy.  The tumor has progressed on sorafenib. Systemic treatment options are limited. We will consider single agent Adriamycin if the tumor does not respond to radiation.  Stuart Moore will return for an office visit in 2-3 weeks.  Betsy Coder, MD  12/27/2014  12:51 PM

## 2014-12-27 NOTE — Telephone Encounter (Signed)
Gave and printed appt sched and avs fo rpt for May.Marland KitchenMarland KitchenI sent msg to karen to sched appt with Dr. Lisbeth Renshaw

## 2014-12-28 NOTE — Progress Notes (Signed)
GI Location of Tumor / Histology:  Metastatic Hepatocellular carcinoma [place] presented  months ago with symptoms of:   Biopsies of (if applicable) revealed: Periportal lymph node biopsy 06/07/14 =metastatic hepatocellular carcinoma,   Past/Anticipated interventions by surgeon, if any: ablation liver lesion 7/215,   Past/Anticipated interventions by medical oncology, if any:Dr. Benay Spice 12/27/14,   Weight changes, if any:   Bowel/Bladder complaints, if any:   Nausea / Vomiting, if any:  Pain issues, if any:  abdominal  SAFETY ISSUES:  Prior radiation? No  Pacemaker/ICD? No  Is the patient on methotrexate : No  Current Complaints / other details:  HX HepC+, Right eye blindness

## 2014-12-31 ENCOUNTER — Ambulatory Visit
Admission: RE | Admit: 2014-12-31 | Discharge: 2014-12-31 | Disposition: A | Discharge status: Home / Self Care | Payer: Non-veteran care | Source: Ambulatory Visit | Attending: Radiation Oncology | Admitting: Radiation Oncology

## 2014-12-31 ENCOUNTER — Ambulatory Visit: Payer: Non-veteran care

## 2014-12-31 DIAGNOSIS — C220 Liver cell carcinoma: Secondary | ICD-10-CM | POA: Insufficient documentation

## 2015-01-02 ENCOUNTER — Other Ambulatory Visit: Payer: Self-pay | Admitting: Radiology

## 2015-01-02 ENCOUNTER — Ambulatory Visit: Payer: Non-veteran care | Admitting: Radiation Oncology

## 2015-01-03 ENCOUNTER — Ambulatory Visit (HOSPITAL_COMMUNITY)
Admission: RE | Admit: 2015-01-03 | Discharge: 2015-01-03 | Disposition: A | Discharge status: Home / Self Care | Payer: Medicare Other | Source: Ambulatory Visit | Attending: Oncology | Admitting: Oncology

## 2015-01-03 ENCOUNTER — Encounter (HOSPITAL_COMMUNITY): Payer: Self-pay

## 2015-01-03 DIAGNOSIS — C220 Liver cell carcinoma: Secondary | ICD-10-CM

## 2015-01-03 DIAGNOSIS — R599 Enlarged lymph nodes, unspecified: Secondary | ICD-10-CM | POA: Insufficient documentation

## 2015-01-03 LAB — GLUCOSE, CAPILLARY: Glucose-Capillary: 164 mg/dL — ABNORMAL HIGH (ref 65–99)

## 2015-01-03 LAB — PROTIME-INR
INR: 1.05 (ref 0.00–1.49)
Prothrombin Time: 13.8 seconds (ref 11.6–15.2)

## 2015-01-03 LAB — CBC
HCT: 37.9 % — ABNORMAL LOW (ref 39.0–52.0)
HEMOGLOBIN: 12 g/dL — AB (ref 13.0–17.0)
MCH: 28.4 pg (ref 26.0–34.0)
MCHC: 31.7 g/dL (ref 30.0–36.0)
MCV: 89.8 fL (ref 78.0–100.0)
PLATELETS: 256 10*3/uL (ref 150–400)
RBC: 4.22 MIL/uL (ref 4.22–5.81)
RDW: 14.4 % (ref 11.5–15.5)
WBC: 8.3 10*3/uL (ref 4.0–10.5)

## 2015-01-03 LAB — APTT: APTT: 31 s (ref 24–37)

## 2015-01-03 MED ORDER — FENTANYL CITRATE (PF) 100 MCG/2ML IJ SOLN
INTRAMUSCULAR | Status: AC
  Filled 2015-01-03: qty 2

## 2015-01-03 MED ORDER — FLUMAZENIL 0.5 MG/5ML IV SOLN
INTRAVENOUS | Status: AC
  Filled 2015-01-03: qty 5

## 2015-01-03 MED ORDER — FENTANYL CITRATE (PF) 100 MCG/2ML IJ SOLN
INTRAMUSCULAR | Status: AC | PRN
  Administered 2015-01-03 (×2): 25 ug via INTRAVENOUS

## 2015-01-03 MED ORDER — MIDAZOLAM HCL 2 MG/2ML IJ SOLN
INTRAMUSCULAR | Status: AC | PRN
  Administered 2015-01-03: 1 mg via INTRAVENOUS
  Administered 2015-01-03: 0.5 mg via INTRAVENOUS

## 2015-01-03 MED ORDER — MIDAZOLAM HCL 2 MG/2ML IJ SOLN
INTRAMUSCULAR | Status: AC
  Filled 2015-01-03: qty 4

## 2015-01-03 MED ORDER — SODIUM CHLORIDE 0.9 % IV SOLN
INTRAVENOUS | Status: DC
  Administered 2015-01-03: 500 mL via INTRAVENOUS

## 2015-01-03 MED ORDER — NALOXONE HCL 0.4 MG/ML IJ SOLN
INTRAMUSCULAR | Status: AC
  Filled 2015-01-03: qty 1
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
## 2015-01-03 NOTE — Discharge Instructions (Signed)
Fine Needle Aspiration °Fine needle aspiration is a procedure used to remove a piece of tissue. The tissue may be removed from a swelling or abnormal growth (tumor). It is also used to confirm a cyst. A cyst is a fluid filled sac. The procedure may be done by your caregiver or a specialist. °LET YOUR CAREGIVER KNOW ABOUT:  °· Any medications you take, especially blood thinners like aspirin. °· Any problems you may have had with similar procedures in the past. °RISKS AND COMPLICATIONS °This is a safe procedure. There is a very small risk of infection and or bleeding. Other complications can occur if the aspiration site is deep in the body. Your caregiver or specialist will explain this to you.  °BEFORE THE PROCEDURE  °· No special preparation is needed in most cases. Your caregiver will let you know if there are special requirements, such as an empty stomach. °· Be sure to ask your caregiver any questions before the procedure starts. °PROCEDURE  °· This procedure is done under local or no anesthetic. °· The skin is cleaned carefully. °· A thin needle is directed into a lump. The needle is directed in several different directions into the lump while suction is applied to the needle. When samples are removed, the needle is withdrawn. °· The contents obtained are placed on a slide. They are then fixed, stained and examined under the microscope. The slide is examined by a specialist in the examination of tissue (pathologist). °· A diagnosis can then be made. The pathologist will decide if the specimen is cancerous (malignant) or not cancerous (benign). If fluid is taken from a cyst, cells from the fluid can be examined. If no material is obtained from a fine needle aspiration, the sample may not rule out a problem. Sometimes the procedure is done again. The pathologist may need several days before a result is available. °AFTER THE PROCEDURE  °· There are usually no limits on diet or activity after a fine needle  aspiration. °· Your caregiver may give you instructions regarding the aspiration site. This will include information about keeping the site clean and dry. Follow these instructions carefully. °· Call for your test results as instructed by your caregiver. Remember, it is your responsibility to get the results of your testing. Do not think everything is fine if you have not heard from your caregiver. °· Keep a close watch on the aspiration site. Report any redness, swelling or drainage. °· You should not have much pain at the aspiration site. °· Take any medications as told by your caregiver. °SEEK MEDICAL CARE IF:  °· You have pain or drainage at the aspiration site that does not go away. °· Swelling at the aspiration site does not gradually go away. °SEEK IMMEDIATE MEDICAL CARE IF:  °· You develop a fever a day or two after the aspiration. °· You develop severe pain at the aspiration site. °· You develop a warm, tender swelling at the aspiration site. °Document Released: 08/07/2000 Document Revised: 11/02/2011 Document Reviewed: 04/20/2008 °ExitCare® Patient Information ©2015 [employer]. This information is not intended to replace advice given to you by your health care provider. Make sure you discuss any questions you have with your health care provider. °Conscious Sedation °Sedation is the use of medicines to promote relaxation and relieve discomfort and anxiety. Conscious sedation is a type of sedation. Under conscious sedation you are less alert than normal but are still able to respond to instructions or stimulation. Conscious sedation is used during   short medical and dental procedures. It is milder than deep sedation or general anesthesia and allows you to return to your regular activities sooner.  °LET YOUR HEALTH CARE PROVIDER KNOW ABOUT:  °· Any allergies you have. °· All medicines you are taking, including vitamins, herbs, eye drops, creams, and over-the-counter medicines. °· Use of steroids (by mouth  or creams). °· Previous problems you or members of your family have had with the use of anesthetics. °· Any blood disorders you have. °· Previous surgeries you have had. °· Medical conditions you have. °· Possibility of pregnancy, if this applies. °· Use of cigarettes, alcohol, or illegal drugs. °RISKS AND COMPLICATIONS °Generally, this is a safe procedure. However, as with any procedure, problems can occur. Possible problems include: °· Oversedation. °· Trouble breathing on your own. You may need to have a breathing tube until you are awake and breathing on your own. °· Allergic reaction to any of the medicines used for the procedure. °BEFORE THE PROCEDURE °· You may have blood tests done. These tests can help show how well your kidneys and liver are working. They can also show how well your blood clots. °· A physical exam will be done.   °· Only take medicines as directed by your health care provider. You may need to stop taking medicines (such as blood thinners, aspirin, or nonsteroidal anti-inflammatory drugs) before the procedure.   °· Do not eat or drink at least 6 hours before the procedure or as directed by your health care provider. °· Arrange for a responsible adult, family member, or friend to take you home after the procedure. He or she should stay with you for at least 24 hours after the procedure, until the medicine has worn off. °PROCEDURE  °· An intravenous (IV) catheter will be inserted into one of your veins. Medicine will be able to flow directly into your body through this catheter. You may be given medicine through this tube to help prevent pain and help you relax. °· The medical or dental procedure will be done. °AFTER THE PROCEDURE °· You will stay in a recovery area until the medicine has worn off. Your blood pressure and pulse will be checked.   °·  Depending on the procedure you had, you may be allowed to go home when you can tolerate liquids and your pain is under control. °Document  Released: 05/05/2001 Document Revised: 08/15/2013 Document Reviewed: 04/17/2013 °ExitCare® Patient Information ©2015 [employer]. This information is not intended to replace advice given to you by your health care provider. Make sure you discuss any questions you have with your health care provider. ° °

## 2015-01-03 NOTE — Procedures (Signed)
L [place] LN Bx 18 g core times 5 No comp

## 2015-01-03 NOTE — Progress Notes (Signed)
Pt arrived today for his Lyphnode biopsy in [place] to be prepped in Short Stay. Pt arrived with a bottled water. Upon further discussion pt states he has had about 3 oz of water between 0700 and arrival to Short Stay and water was removed from patient and given to wife. This was reported to Gareth Eagle PA radiology and I am to proceed with prep fo procedure

## 2015-01-04 ENCOUNTER — Encounter: Payer: Self-pay | Admitting: *Deleted

## 2015-01-04 NOTE — [facility] Oncology Navigator Note (Signed)
Update from [facility] in radiation oncology: [facility] that [facility] must come from the PCP. She has called the patient to inform him of procedure and he will call. She will follow up on this Monday. HIM has faxed request for records from [place] twice with no response.

## 2015-01-09 ENCOUNTER — Telehealth: Payer: Self-pay | Admitting: *Deleted

## 2015-01-09 NOTE — Telephone Encounter (Signed)
Left VM requesting return call with update on the status of his referral from PCP to radiation oncology.

## 2015-01-10 ENCOUNTER — Encounter: Payer: Self-pay | Admitting: *Deleted

## 2015-01-10 ENCOUNTER — Telehealth: Payer: Self-pay | Admitting: *Deleted

## 2015-01-10 NOTE — Progress Notes (Signed)
[facility] Psychosocial Distress Screening Clinical Social Work  Clinical Social Work was referred by distress screening protocol.  The patient scored a 5 on the Psychosocial Distress Thermometer which indicates moderate distress. Clinical Social Worker phoned pt to assess for distress and other psychosocial needs. CSW had to leave a message and awaits return call.   ONCBCN DISTRESS SCREENING 12/27/2014  Distress experienced in past week (1-10) 5  Practical problem type (No Data)  Emotional problem type Adjusting to illness  Physical Problem type Pain;Loss of appetitie  Physician notified of physical symptoms Yes     Clinical Social Worker follow up needed: Yes.    If yes, follow up plan: See above Loren Racer, [place] Worker [place]  [facility] Phone: [phone] Fax: [phone]

## 2015-01-10 NOTE — Telephone Encounter (Signed)
-----   Message from Ladell Pier, MD sent at 01/07/2015  2:38 PM EDT ----- Please call patient, biopsy confirms hepatocellular carcinoma, f/u as scheduled

## 2015-01-10 NOTE — Telephone Encounter (Signed)
Informed pt of biopsy result. Confirms hepatocellular carcinoma, per Dr. Benay Spice. Follow up as scheduled. He voiced understanding, stated he was hoping it was not the cancer.

## 2015-01-10 NOTE — [facility] Oncology Navigator Note (Signed)
Spoke with Ailene Ravel in radiation oncology-she spoke with patient earlier this week and he still has not contacted his PCP to get the referral to see Dr. Lisbeth Renshaw. He told her he would do it this week. Will follow up on this tomorrow.

## 2015-01-16 ENCOUNTER — Ambulatory Visit (HOSPITAL_BASED_OUTPATIENT_CLINIC_OR_DEPARTMENT_OTHER): Payer: Medicare Other | Admitting: Oncology

## 2015-01-16 ENCOUNTER — Telehealth: Payer: Self-pay | Admitting: Oncology

## 2015-01-16 VITALS — BP 100/73 | HR 68 | Temp 97.7°F | Resp 18 | Ht 68.0 in | Wt 174.9 lb

## 2015-01-16 DIAGNOSIS — H5441 Blindness, right eye, normal vision left eye: Secondary | ICD-10-CM

## 2015-01-16 DIAGNOSIS — C770 Secondary and unspecified malignant neoplasm of lymph nodes of head, face and neck: Secondary | ICD-10-CM | POA: Diagnosis not present

## 2015-01-16 DIAGNOSIS — C772 Secondary and unspecified malignant neoplasm of intra-abdominal lymph nodes: Secondary | ICD-10-CM

## 2015-01-16 DIAGNOSIS — K5900 Constipation, unspecified: Secondary | ICD-10-CM

## 2015-01-16 DIAGNOSIS — C220 Liver cell carcinoma: Secondary | ICD-10-CM

## 2015-01-16 DIAGNOSIS — G893 Neoplasm related pain (acute) (chronic): Secondary | ICD-10-CM | POA: Diagnosis not present

## 2015-01-16 DIAGNOSIS — E119 Type 2 diabetes mellitus without complications: Secondary | ICD-10-CM | POA: Diagnosis not present

## 2015-01-16 NOTE — Telephone Encounter (Signed)
Pt confirmed MD visit per 05/25 POF, gave pt AVS and Calendar.... KJ

## 2015-01-16 NOTE — [facility] Oncology Navigator Note (Signed)
Oncology Nurse Navigator Documentation  Oncology Nurse Navigator Flowsheets 01/16/2015  Navigator Encounter Type Follow Up-1 month  Patient Visit Type Medonc  Treatment Phase Tx planning-RT/Clinical Trial  Barriers/Navigation Needs No barriers at this time  Interventions Coordination of Care--  Coordination of Care Other  Time Spent with Patient 15

## 2015-01-16 NOTE — Progress Notes (Signed)
  [facility] OFFICE PROGRESS NOTE   Diagnosis: Hepatocellular carcinoma  INTERVAL HISTORY:   [name] returns as scheduled. He underwent an ultrasound guided biopsy of the left supra-clavicular lymph node on 01/03/2015. The pathology confirmed metastatic hepatocellular carcinoma. He reports good pain control with MS Contin and oxycodone. He is constipated. He is beginning a bowel regimen. He saw Dr. Merrie Roof earlier today and she is looking into clinical trial availability.  Objective:  Vital signs in last 24 hours:  Blood pressure 100/73, pulse 68, temperature 97.7 F (36.5 C), temperature source Oral, resp. rate 18, height 5\' 8"  (1.727 m), weight 174 lb 14.4 oz (79.334 kg), SpO2 99 %.    HEENT: Firm mass in the medial left supra-clavicular fossa Resp: Lungs clear bilaterally Cardio: Regular rate and rhythm GI: No hepatomegaly, fullness with associated tenderness in the mid upper abdomen Vascular: No leg edema  Medications: I have reviewed the patient's current medications.  Assessment/Plan: 1. Hepatocellular carcinoma  Status post microwave ablation of a liver lesion I 7 2015  Biopsy of a periportal lymph node 06/07/2014 confirmed metastatic hepatocellular carcinoma  Sorafenib started November 2015  CT 12/20/2014 with an enlarged left supraclavicular node, progressive bulky upper abdominal lymphadenopathy, stable apparent ablation defect in segment 8 of the liver, stable hyperenhancing and in segment 7 of the liver  Biopsy of the left supraclavicular lymph node on 01/03/2015 confirmed metastatic hepatocellular carcinoma   2. Hepatitis C/cirrhosis-treated successfully for hepatitis C infection at the [facility]  3. Diabetes  4. Pain secondary to #1  5. Right eye blindness    Disposition:  He appears stable. He will continue the current narcotic regimen. He will use MiraLAX and Senokot for the constipation.  I will contact Dr. Merrie Roof to  discuss systemic treatment options including clinical trials. If he is not a candidate for a clinical trial near future he will be referred to radiation oncology to consider palliative radiation to the upper abdominal lymph nodes.  Mr. Crookshanks will return for an office visit in one month.  Betsy Coder, MD  01/16/2015  12:18 PM

## 2015-01-18 ENCOUNTER — Encounter (HOSPITAL_COMMUNITY): Payer: Self-pay | Admitting: Emergency Medicine

## 2015-01-18 ENCOUNTER — Emergency Department (HOSPITAL_COMMUNITY): Payer: Medicare Other

## 2015-01-18 ENCOUNTER — Other Ambulatory Visit: Payer: Self-pay

## 2015-01-18 ENCOUNTER — Emergency Department (HOSPITAL_COMMUNITY)
Admission: EM | Admit: 2015-01-18 | Discharge: 2015-01-18 | Disposition: A | Discharge status: Home / Self Care | Payer: Medicare Other | Attending: Emergency Medicine | Admitting: Emergency Medicine

## 2015-01-18 DIAGNOSIS — Z87891 Personal history of nicotine dependence: Secondary | ICD-10-CM | POA: Insufficient documentation

## 2015-01-18 DIAGNOSIS — Z87442 Personal history of urinary calculi: Secondary | ICD-10-CM | POA: Insufficient documentation

## 2015-01-18 DIAGNOSIS — Z79899 Other long term (current) drug therapy: Secondary | ICD-10-CM | POA: Insufficient documentation

## 2015-01-18 DIAGNOSIS — R1011 Right upper quadrant pain: Secondary | ICD-10-CM | POA: Diagnosis not present

## 2015-01-18 DIAGNOSIS — Z8505 Personal history of malignant neoplasm of liver: Secondary | ICD-10-CM | POA: Insufficient documentation

## 2015-01-18 DIAGNOSIS — Z8719 Personal history of other diseases of the digestive system: Secondary | ICD-10-CM | POA: Diagnosis not present

## 2015-01-18 DIAGNOSIS — E119 Type 2 diabetes mellitus without complications: Secondary | ICD-10-CM | POA: Diagnosis not present

## 2015-01-18 DIAGNOSIS — I10 Essential (primary) hypertension: Secondary | ICD-10-CM | POA: Insufficient documentation

## 2015-01-18 DIAGNOSIS — Z8619 Personal history of other infectious and parasitic diseases: Secondary | ICD-10-CM | POA: Diagnosis not present

## 2015-01-18 DIAGNOSIS — Z794 Long term (current) use of insulin: Secondary | ICD-10-CM | POA: Diagnosis not present

## 2015-01-18 DIAGNOSIS — Z9889 Other specified postprocedural states: Secondary | ICD-10-CM | POA: Diagnosis not present

## 2015-01-18 DIAGNOSIS — R1013 Epigastric pain: Secondary | ICD-10-CM | POA: Insufficient documentation

## 2015-01-18 LAB — URINALYSIS, ROUTINE W REFLEX MICROSCOPIC
Glucose, UA: NEGATIVE mg/dL
Hgb urine dipstick: NEGATIVE
Ketones, ur: 15 mg/dL — AB
LEUKOCYTES UA: NEGATIVE
Nitrite: NEGATIVE
Protein, ur: NEGATIVE mg/dL
Specific Gravity, Urine: 1.028 (ref 1.005–1.030)
UROBILINOGEN UA: 1 mg/dL (ref 0.0–1.0)
pH: 6.5 (ref 5.0–8.0)

## 2015-01-18 LAB — I-STAT CHEM 8, ED
BUN: 13 mg/dL (ref 6–20)
Calcium, Ion: 1.08 mmol/L — ABNORMAL LOW (ref 1.12–1.23)
Chloride: 100 mmol/L — ABNORMAL LOW (ref 101–111)
Creatinine, Ser: 0.8 mg/dL (ref 0.61–1.24)
GLUCOSE: 174 mg/dL — AB (ref 65–99)
HCT: 47 % (ref 39.0–52.0)
HEMOGLOBIN: 16 g/dL (ref 13.0–17.0)
POTASSIUM: 5.5 mmol/L — AB (ref 3.5–5.1)
Sodium: 138 mmol/L (ref 135–145)
TCO2: 27 mmol/L (ref 0–100)

## 2015-01-18 LAB — CBC WITH DIFFERENTIAL/PLATELET
Basophils Absolute: 0 10*3/uL (ref 0.0–0.1)
Basophils Relative: 0 % (ref 0–1)
EOS ABS: 0.1 10*3/uL (ref 0.0–0.7)
Eosinophils Relative: 1 % (ref 0–5)
HEMATOCRIT: 38 % — AB (ref 39.0–52.0)
HEMOGLOBIN: 12.3 g/dL — AB (ref 13.0–17.0)
LYMPHS PCT: 35 % (ref 12–46)
Lymphs Abs: 4.2 10*3/uL — ABNORMAL HIGH (ref 0.7–4.0)
MCH: 28.7 pg (ref 26.0–34.0)
MCHC: 32.4 g/dL (ref 30.0–36.0)
MCV: 88.8 fL (ref 78.0–100.0)
Monocytes Absolute: 1.1 10*3/uL — ABNORMAL HIGH (ref 0.1–1.0)
Monocytes Relative: 9 % (ref 3–12)
Neutro Abs: 6.6 10*3/uL (ref 1.7–7.7)
Neutrophils Relative %: 55 % (ref 43–77)
Platelets: 365 10*3/uL (ref 150–400)
RBC: 4.28 MIL/uL (ref 4.22–5.81)
RDW: 14.1 % (ref 11.5–15.5)
WBC: 12 10*3/uL — ABNORMAL HIGH (ref 4.0–10.5)

## 2015-01-18 LAB — COMPREHENSIVE METABOLIC PANEL
ALBUMIN: 4 g/dL (ref 3.5–5.0)
ALT: 23 U/L (ref 17–63)
AST: 66 U/L — AB (ref 15–41)
Alkaline Phosphatase: 106 U/L (ref 38–126)
Anion gap: 11 (ref 5–15)
BILIRUBIN TOTAL: 1.8 mg/dL — AB (ref 0.3–1.2)
BUN: 10 mg/dL (ref 6–20)
CHLORIDE: 99 mmol/L — AB (ref 101–111)
CO2: 24 mmol/L (ref 22–32)
Calcium: 9 mg/dL (ref 8.9–10.3)
Creatinine, Ser: 0.95 mg/dL (ref 0.61–1.24)
GLUCOSE: 229 mg/dL — AB (ref 65–99)
Potassium: 6.2 mmol/L (ref 3.5–5.1)
Sodium: 134 mmol/L — ABNORMAL LOW (ref 135–145)
Total Protein: 7.7 g/dL (ref 6.5–8.1)

## 2015-01-18 LAB — LIPASE, BLOOD: LIPASE: 21 U/L — AB (ref 22–51)

## 2015-01-18 LAB — TROPONIN I: Troponin I: <0.03 ng/mL (ref <0.031)

## 2015-01-18 MED ORDER — SODIUM CHLORIDE 0.9 % IV BOLUS (SEPSIS)
500.0000 mL | Freq: Once | INTRAVENOUS | Status: AC
  Administered 2015-01-18: 500 mL via INTRAVENOUS

## 2015-01-18 MED ORDER — SODIUM POLYSTYRENE SULFONATE 15 GM/60ML PO SUSP
15.0000 g | Freq: Once | ORAL | Status: AC
  Administered 2015-01-18: 15 g via ORAL
  Filled 2015-01-18: qty 60

## 2015-01-18 MED ORDER — ONDANSETRON HCL 4 MG/2ML IJ SOLN
4.0000 mg | Freq: Once | INTRAMUSCULAR | Status: AC
  Administered 2015-01-18: 4 mg via INTRAVENOUS
  Filled 2015-01-18: qty 2

## 2015-01-18 MED ORDER — FUROSEMIDE 10 MG/ML IJ SOLN
40.0000 mg | Freq: Once | INTRAMUSCULAR | Status: AC
  Administered 2015-01-18: 40 mg via INTRAVENOUS
  Filled 2015-01-18: qty 4

## 2015-01-18 MED ORDER — IOHEXOL 300 MG/ML  SOLN
50.0000 mL | Freq: Once | INTRAMUSCULAR | Status: AC | PRN
  Administered 2015-01-18: 50 mL via ORAL

## 2015-01-18 MED ORDER — IOHEXOL 300 MG/ML  SOLN
100.0000 mL | Freq: Once | INTRAMUSCULAR | Status: AC | PRN
  Administered 2015-01-18: 100 mL via INTRAVENOUS

## 2015-01-18 MED ORDER — SODIUM CHLORIDE 0.9 % IV BOLUS (SEPSIS)
1000.0000 mL | Freq: Once | INTRAVENOUS | Status: AC
  Administered 2015-01-18: 1000 mL via INTRAVENOUS

## 2015-01-18 MED ORDER — MORPHINE SULFATE 4 MG/ML IJ SOLN
4.0000 mg | Freq: Once | INTRAMUSCULAR | Status: AC
  Administered 2015-01-18: 4 mg via INTRAVENOUS
  Filled 2015-01-18: qty 1

## 2015-01-18 NOTE — ED Notes (Signed)
Pt from home via EMS-Per EMS, pt has hx of stage 4 liver cancer. Pt c/o emesis and nausea that stated this am. Pt reports that he had 1 glass of wine last night. Pt adds that he has 10/10 RUQ pain. Pt is A&O and in NAD

## 2015-01-18 NOTE — ED Notes (Signed)
Pt from home c/o nausea/emesis with sudden onset of stomach pains. Pt c/o 10/10 RUQ pain. Pt denies blood in urine/stool. Pt is A&O and in NAD
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
## 2015-01-18 NOTE — ED Notes (Signed)
Bed: WA17 Expected date:  Expected time:  Means of arrival:  Comments: EMS- Liver CA, n/v

## 2015-01-18 NOTE — ED Provider Notes (Signed)
Date: 01/18/2015  Rate: 80  Rhythm: normal sinus rhythm  QRS Axis: normal  Intervals: normal  ST/T Wave abnormalities: normal  Conduction Disutrbances: none  Narrative Interpretation: unremarkable ekg not available in epic for interpretation in muse     Alfonzo Beers, MD 01/20/15 1157

## 2015-01-22 ENCOUNTER — Encounter: Payer: Self-pay | Admitting: *Deleted

## 2015-01-22 NOTE — Progress Notes (Unsigned)
Dr. Benay Spice spoke with medical oncologist at [facility], Dr. Merrie Roof [phone]. Agreed with need for radiation therapy to begin now per Dr. Lisbeth Renshaw. Notified Dr. Ida Rogue office. Patient reports that Dr. Merrie Roof will be sending the authorization for him to be seen and treatred.

## 2015-01-25 ENCOUNTER — Encounter: Payer: Self-pay | Admitting: *Deleted

## 2015-01-25 NOTE — Progress Notes (Signed)
Received information from [facility] in radiation oncology that they have received no referral from his PCP to see and be treated by Dr. Lisbeth Renshaw.Hanley Seamen name of Dr. Dierdre Searles at the [facility] as his PCP that seemed willing to help obtain the referral. Faxed Dr. Gearldine Shown last office note there to # [phone] att: [facility]. Noted that his care is waiting referral authorization from them. Please process asap.

## 2015-02-12 ENCOUNTER — Emergency Department (HOSPITAL_COMMUNITY): Payer: Medicare Other

## 2015-02-12 ENCOUNTER — Emergency Department (HOSPITAL_COMMUNITY)
Admission: EM | Admit: 2015-02-12 | Discharge: 2015-02-12 | Disposition: A | Discharge status: Home / Self Care | Payer: Medicare Other | Attending: Emergency Medicine | Admitting: Emergency Medicine

## 2015-02-12 ENCOUNTER — Encounter (HOSPITAL_COMMUNITY): Payer: Self-pay | Admitting: Emergency Medicine

## 2015-02-12 ENCOUNTER — Telehealth: Payer: Self-pay | Admitting: *Deleted

## 2015-02-12 DIAGNOSIS — Z794 Long term (current) use of insulin: Secondary | ICD-10-CM | POA: Insufficient documentation

## 2015-02-12 DIAGNOSIS — Z87891 Personal history of nicotine dependence: Secondary | ICD-10-CM | POA: Insufficient documentation

## 2015-02-12 DIAGNOSIS — C229 Malignant neoplasm of liver, not specified as primary or secondary: Secondary | ICD-10-CM | POA: Insufficient documentation

## 2015-02-12 DIAGNOSIS — Z9889 Other specified postprocedural states: Secondary | ICD-10-CM | POA: Insufficient documentation

## 2015-02-12 DIAGNOSIS — E119 Type 2 diabetes mellitus without complications: Secondary | ICD-10-CM | POA: Insufficient documentation

## 2015-02-12 DIAGNOSIS — R1011 Right upper quadrant pain: Secondary | ICD-10-CM | POA: Diagnosis not present

## 2015-02-12 DIAGNOSIS — Z8619 Personal history of other infectious and parasitic diseases: Secondary | ICD-10-CM | POA: Insufficient documentation

## 2015-02-12 DIAGNOSIS — Z87442 Personal history of urinary calculi: Secondary | ICD-10-CM | POA: Insufficient documentation

## 2015-02-12 DIAGNOSIS — R1013 Epigastric pain: Secondary | ICD-10-CM | POA: Insufficient documentation

## 2015-02-12 DIAGNOSIS — I10 Essential (primary) hypertension: Secondary | ICD-10-CM | POA: Insufficient documentation

## 2015-02-12 DIAGNOSIS — R109 Unspecified abdominal pain: Secondary | ICD-10-CM | POA: Diagnosis present

## 2015-02-12 DIAGNOSIS — Z87828 Personal history of other (healed) physical injury and trauma: Secondary | ICD-10-CM | POA: Insufficient documentation

## 2015-02-12 DIAGNOSIS — Z79899 Other long term (current) drug therapy: Secondary | ICD-10-CM | POA: Diagnosis not present

## 2015-02-12 DIAGNOSIS — K7469 Other cirrhosis of liver: Secondary | ICD-10-CM | POA: Insufficient documentation

## 2015-02-12 DIAGNOSIS — R0602 Shortness of breath: Secondary | ICD-10-CM | POA: Insufficient documentation

## 2015-02-12 LAB — CBC WITH DIFFERENTIAL/PLATELET
BASOS ABS: 0 10*3/uL (ref 0.0–0.1)
BASOS PCT: 0 % (ref 0–1)
EOS ABS: 0.2 10*3/uL (ref 0.0–0.7)
Eosinophils Relative: 2 % (ref 0–5)
HCT: 38.6 % — ABNORMAL LOW (ref 39.0–52.0)
Hemoglobin: 12.5 g/dL — ABNORMAL LOW (ref 13.0–17.0)
Lymphocytes Relative: 29 % (ref 12–46)
Lymphs Abs: 2.8 10*3/uL (ref 0.7–4.0)
MCH: 28.2 pg (ref 26.0–34.0)
MCHC: 32.4 g/dL (ref 30.0–36.0)
MCV: 86.9 fL (ref 78.0–100.0)
Monocytes Absolute: 1.1 10*3/uL — ABNORMAL HIGH (ref 0.1–1.0)
Monocytes Relative: 11 % (ref 3–12)
Neutro Abs: 5.8 10*3/uL (ref 1.7–7.7)
Neutrophils Relative %: 58 % (ref 43–77)
PLATELETS: 298 10*3/uL (ref 150–400)
RBC: 4.44 MIL/uL (ref 4.22–5.81)
RDW: 13.8 % (ref 11.5–15.5)
WBC: 9.9 10*3/uL (ref 4.0–10.5)

## 2015-02-12 LAB — COMPREHENSIVE METABOLIC PANEL
ALT: 23 U/L (ref 17–63)
ANION GAP: 11 (ref 5–15)
AST: 24 U/L (ref 15–41)
Albumin: 4 g/dL (ref 3.5–5.0)
Alkaline Phosphatase: 108 U/L (ref 38–126)
BUN: 12 mg/dL (ref 6–20)
CALCIUM: 9.3 mg/dL (ref 8.9–10.3)
CO2: 26 mmol/L (ref 22–32)
CREATININE: 0.82 mg/dL (ref 0.61–1.24)
Chloride: 99 mmol/L — ABNORMAL LOW (ref 101–111)
GFR calc non Af Amer: >60 mL/min (ref >60)
GLUCOSE: 147 mg/dL — AB (ref 65–99)
Potassium: 4.4 mmol/L (ref 3.5–5.1)
SODIUM: 136 mmol/L (ref 135–145)
Total Bilirubin: 0.3 mg/dL (ref 0.3–1.2)
Total Protein: 8.1 g/dL (ref 6.5–8.1)

## 2015-02-12 LAB — I-STAT TROPONIN, ED: Troponin i, poc: 0.01 ng/mL (ref 0.00–0.08)

## 2015-02-12 LAB — LIPASE, BLOOD: LIPASE: 24 U/L (ref 22–51)

## 2015-02-12 LAB — I-STAT CG4 LACTIC ACID, ED: LACTIC ACID, VENOUS: 0.68 mmol/L (ref 0.5–2.0)

## 2015-02-12 MED ORDER — MORPHINE SULFATE 4 MG/ML IJ SOLN
4.0000 mg | Freq: Once | INTRAMUSCULAR | Status: AC
  Administered 2015-02-12: 4 mg via INTRAVENOUS
  Filled 2015-02-12: qty 1

## 2015-02-12 NOTE — ED Notes (Signed)
Bed: WA19 Expected date:  Expected time:  Means of arrival:  Comments: 

## 2015-02-12 NOTE — Telephone Encounter (Signed)
CT SCAN WAS DONE. DR.RAJ THINKS PT. HAS A LIVER INFECTION. TODAY PT.'S WIFE SUGGESTED TO DR.RAJ TO SEND THE CT SCAN TO DR.SHERRILL. DR.RAJ TO FAX CT SCAN TO DR.SHERRILL.

## 2015-02-12 NOTE — Discharge Instructions (Signed)
Continue taking your pain meds as prescribed.  Follow up with your doctor and rad onc doctor tomorrow.  Return to ER if you have fever, severe pain, vomiting.

## 2015-02-12 NOTE — Progress Notes (Addendum)
GI Location of Tumor / Histology: Hepatocellular Carcinoma Stage IV=  Left supraclavicular lymph node metastatic mass  Stuart Moore presentedmonths ago with symptoms of: abdominal chest pain, constant upset stomach(6 months)  . Biopsies of(if applicable) revealed: U/S guided Lymph node, needle/core biopsyDiagnosis  01/03/15: - METASTATIC HEPATOCELLULAR CARCINOMA, SEE COMMENT. 06/07/14: Periportal lymph node biospy=Metasatic Tumor cells present=with metastatic hepaocellular carcinoma ,[name] Past/Anticipated interventions by surgeon, if any:   Past/Anticipated interventions by medical oncology, if any: Dr. Benay Spice visit 01/16/15, Sorafenib started November 2015, referal for palliative radiation upper abdominal lymph nodes f/u 02/13/15@ 1100am  Weight changes, if any: 12 lbs loss past couple months  Bowel/Bladder complaints, if any: Constipation , stomach constantly upset,   Nausea / Vomiting, if any: No, poor appetite   Pain issues, if any:  Abdominal pain  7/10 scale today  SAFETY ISSUES:  Prior radiation?  No  Pacemaker/ICD? NO  Is the patient on methotrexate? NO  Current Complaints / other details:   Hx Ablation of a liver lesion 02/2014, Hx Hep-C 2000,  treated successful  at [facility],  Cirrhosis one year of interferon,   Blindness right eye, DM,former smoker cigarettes 35 years,quit 7 years ago,never smokeless tobacco  ,no alcohol or illicit drugs   Allergies:Been venom, anaphylactic BP 145/82 mmHg  Pulse 81  Temp(Src) 98.1 F (36.7 C) (Oral)  Resp 20  Wt 165 lb 12.8 oz (75.206 kg)  SpO2 100%  Wt Readings from Last 3 Encounters:  02/13/15 165 lb 12.8 oz (75.206 kg)  01/16/15 174 lb 14.4 oz (79.334 kg)  01/03/15 177 lb 9 oz (80.542 kg)
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
## 2015-02-12 NOTE — ED Notes (Addendum)
Pt reports yesterday he had CT of abd which showed there was an infection in his liver. Pt has stage 4 cancer, not on chemo at present, supposed to start radiation tomorrow. Pt also reports worsening of his abd pain along with decreased appetite, no emesis. Also has ongoing SOB, speaking in full sentences with no difficulty.

## 2015-02-13 ENCOUNTER — Ambulatory Visit
Admission: RE | Admit: 2015-02-13 | Discharge: 2015-02-13 | Disposition: A | Discharge status: Home / Self Care | Payer: Non-veteran care | Source: Ambulatory Visit | Attending: Radiation Oncology | Admitting: Radiation Oncology

## 2015-02-13 ENCOUNTER — Telehealth: Payer: Self-pay | Admitting: Oncology

## 2015-02-13 ENCOUNTER — Encounter: Payer: Self-pay | Admitting: Radiation Oncology

## 2015-02-13 ENCOUNTER — Ambulatory Visit (HOSPITAL_BASED_OUTPATIENT_CLINIC_OR_DEPARTMENT_OTHER): Payer: Medicare Other | Admitting: Oncology

## 2015-02-13 VITALS — BP 145/82 | HR 81 | Temp 98.1°F | Resp 20 | Wt 165.8 lb

## 2015-02-13 VITALS — BP 132/83 | HR 84 | Temp 98.3°F | Resp 18 | Ht 68.0 in | Wt 169.0 lb

## 2015-02-13 DIAGNOSIS — C220 Liver cell carcinoma: Secondary | ICD-10-CM

## 2015-02-13 DIAGNOSIS — C770 Secondary and unspecified malignant neoplasm of lymph nodes of head, face and neck: Secondary | ICD-10-CM | POA: Diagnosis not present

## 2015-02-13 DIAGNOSIS — H5441 Blindness, right eye, normal vision left eye: Secondary | ICD-10-CM | POA: Diagnosis not present

## 2015-02-13 DIAGNOSIS — E119 Type 2 diabetes mellitus without complications: Secondary | ICD-10-CM | POA: Diagnosis not present

## 2015-02-13 DIAGNOSIS — C7951 Secondary malignant neoplasm of bone: Principal | ICD-10-CM

## 2015-02-13 DIAGNOSIS — G893 Neoplasm related pain (acute) (chronic): Secondary | ICD-10-CM

## 2015-02-13 DIAGNOSIS — C772 Secondary and unspecified malignant neoplasm of intra-abdominal lymph nodes: Secondary | ICD-10-CM | POA: Diagnosis not present

## 2015-02-13 NOTE — Telephone Encounter (Signed)
per pof to sch pt appt-gave pt copy of avs °

## 2015-02-13 NOTE — Progress Notes (Signed)
[facility] OFFICE PROGRESS NOTE   Diagnosis: Hepatocellular carcinoma.  INTERVAL HISTORY:   Stuart Moore returns as scheduled. He continues to have upper abdominal pain. He reports anorexia and early satiety. No nausea or vomiting. His bowels are moving. He takes MS Contin and requires oxycodone approximate twice daily for breakthrough pain. He saw Dr. Lisbeth Renshaw and is being scheduled for palliative radiation. He underwent a repeat CT of the abdomen at the [place] on 02/11/2015. This revealed progressive metastatic adenopathy up her abdomen and left supraclavicular regions. Liver cirrhosis was noted with posttreatment changes in segment 4. A new 2.4 cm mass was noted in segment 3 suspicious for hepatocellular carcinoma. There was evidence of bland or tumor thrombus in the portal vein or hepatic vein branches. Focally dilated intrahepatic bile ducts such as can be seen with cholangitis was considered a possibility.   He was sent to the emergency room when the CT result returned. He reports no symptoms of cholangitis. No fever or chills. An abdominal ultrasound showed no acute change. Objective:  Vital signs in last 24 hours:  Blood pressure 132/83, pulse 84, temperature 98.3 F (36.8 C), temperature source Oral, resp. rate 18, height 5\' 8"  (1.727 m), weight 169 lb (76.658 kg), SpO2 100 %.    HEENT: Firm mass in the left supraclavicular fossa Resp: Lungs clear bilaterally Cardio: Regular rate and rhythm GI: No hepatosplenomegaly, mass in the mid upper abdomen with associated tenderness, no apparent ascites Vascular: No leg edema   Lab Results:  Lab Results  Component Value Date   WBC 9.9 02/12/2015   HGB 12.5* 02/12/2015   HCT 38.6* 02/12/2015   MCV 86.9 02/12/2015   PLT 298 02/12/2015   NEUTROABS 5.8 02/12/2015     Imaging:  US Abdomen Complete  02/12/2015   CLINICAL DATA:  Right upper quadrant pain  EXAM: ULTRASOUND ABDOMEN COMPLETE  COMPARISON:  01/18/2015   FINDINGS: Gallbladder: Gallbladder wall is thickened measuring up to 4 mm. Gallbladder sludge noted. No pericholecystic fluid. Negative sonographic Murphy's.  Common bile duct: Diameter: 4 mm.  Liver: There is a 1.8 x 1.6 x 1.9 mm hypoechoic structure within the left lobe of liver. The contour the liver is irregular suggesting cirrhosis.  IVC: No abnormality visualized.  Pancreas: Visualized portion unremarkable.  Spleen: Size and appearance within normal limits.  Right Kidney: Length: 11.4 cm. Echogenicity within normal limits. No mass or hydronephrosis visualized.  Left Kidney: Length: 12 cm. Echogenicity within normal limits. No mass or hydronephrosis visualized.  Abdominal aorta: No aneurysm visualized.  Other findings: Upper abdominal adenopathy again noted. No fluid collection or free fluid noted.  IMPRESSION: 1. Morphologic features of the liver suggestive of cirrhosis. 2. Hypoechoic structure within the left lobe of liver is noted and could represent an area of [place]. This could be better assessed with followup dedicated MR or CT of the liver. 3. Upper abdominal adenopathy. 4. Thickening of the gallbladder wall is nonspecific in the setting of cirrhosis.   Electronically Signed   By: Kerby Moors M.D.   On: 02/12/2015 17:23   Dg Abd Acute W/chest  02/12/2015   CLINICAL DATA:  Worsening abdominal pain.  Stage IV cancer.  EXAM: DG ABDOMEN ACUTE W/ 1V CHEST  COMPARISON:  01/18/2015.  FINDINGS: Subsegmental atelectasis at the lung bases. Cardiopericardial silhouette within normal limits. Blunting of the LEFT costophrenic angle is present which is a chronic finding, probably due to scarring.  No free air underneath the hemidiaphragms. Nonobstructive bowel gas pattern.  Large RIGHT-sided stool burden is incidentally noted. No dilated loops of large or small bowel. Antegrade RIGHT femoral nail is noted.  IMPRESSION: 1. Normal bowel gas pattern aside from prominent RIGHT-sided stool burden. 2. Bibasilar  atelectasis in the lungs.   Electronically Signed   By: Dereck Ligas M.D.   On: 02/12/2015 16:20    Medications: I have reviewed the patient's current medications.  Assessment/Plan: 1. Hepatocellular carcinoma  Status post microwave ablation of a liver lesion I 7 2015  Biopsy of a periportal lymph node 06/07/2014 confirmed metastatic hepatocellular carcinoma  Sorafenib started November 2015  CT 12/20/2014 with an enlarged left supraclavicular node, progressive bulky upper abdominal lymphadenopathy, stable apparent ablation defect in segment 8 of the liver, stable hyperenhancing and in segment 7 of the liver  Biopsy of the left supraclavicular lymph node on 01/03/2015 confirmed metastatic hepatocellular carcinoma  CT 02/11/2015 at the [facility] revealed progressive lymphadenopathy in the upper abdomen and left supraclavicular regions, a new 2.4 Center mass in segment 3, and possible changes of bland tumor thrombus or portal vein/hepatic vein thrombus   2. Hepatitis C/cirrhosis-treated successfully for hepatitis C infection at the [facility]  3. Diabetes  4. Pain secondary to #1  5. Right eye blindness   Disposition:  Stuart Moore has metastatic hepatocellular carcinoma. He will continue the current narcotic pain regimen. He is scheduled to begin palliative radiation to upper abdominal lymph nodes in the near future. He will return for an office visit in 3 weeks.  I discussed the case with Dr.Raj after the last office visit here. There is currently not an active clinical trial at [place], [place], MD  02/13/2015  11:14 AM
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
## 2015-02-13 NOTE — Progress Notes (Signed)
Please see the Nurse Progress Note in the MD Initial Consult Encounter for this patient. 

## 2015-02-15 ENCOUNTER — Ambulatory Visit
Admission: RE | Admit: 2015-02-15 | Discharge: 2015-02-15 | Disposition: A | Discharge status: Home / Self Care | Payer: Non-veteran care | Source: Ambulatory Visit | Attending: Radiation Oncology | Admitting: Radiation Oncology

## 2015-02-15 ENCOUNTER — Ambulatory Visit: Admission: RE | Admit: 2015-02-15 | Payer: Non-veteran care | Source: Ambulatory Visit | Admitting: Radiation Oncology

## 2015-02-17 LAB — CULTURE, BLOOD (ROUTINE X 2)
CULTURE: NO GROWTH
Culture: NO GROWTH

## 2015-02-18 ENCOUNTER — Other Ambulatory Visit: Payer: Self-pay | Admitting: Hematology and Oncology

## 2015-02-18 DIAGNOSIS — R160 Hepatomegaly, not elsewhere classified: Secondary | ICD-10-CM

## 2015-02-20 ENCOUNTER — Ambulatory Visit: Admission: RE | Admit: 2015-02-20 | Payer: Non-veteran care | Source: Ambulatory Visit | Admitting: Radiation Oncology

## 2015-02-20 ENCOUNTER — Encounter: Payer: Self-pay | Admitting: *Deleted

## 2015-02-20 NOTE — [facility] Oncology Navigator Note (Signed)
Notified by radiation oncology that Dr. Merrie Roof at [facility] has now referred patient to interventional radiology for liver ablation and it is scheduled for 02/26/15. Still no authorization available for him to see Dr. Lisbeth Renshaw- Contact at [facility] : [facility] [phone] ext [phone] (or ext [phone])

## 2015-02-21 ENCOUNTER — Encounter: Payer: Self-pay | Admitting: *Deleted

## 2015-02-21 NOTE — Progress Notes (Signed)
Stuart Moore Psychosocial Distress Screening Clinical Social Work  Clinical Social Work was referred by distress screening protocol.  The patient scored a 5 on the Psychosocial Distress Thermometer which indicates moderate distress. Clinical Social Worker called pt at home to assess for distress and other psychosocial needs. Pt reports he has no appetite and therefore checked off food concerns. He does not have food insecurity and receives glucerna through the [place]. He reports to have really good support through family and friends. He was recently told through MD at the [place] he may have less than a year to live and he is processing emotions surrounding this type of news. CSW discussed these with him and ways to cope.  CSW explained role of CSW and pt and family support team members. He would love to speak with dietician and CSW to make referral. Pt aware and agrees to reach out as he feels the need. He was very appreciative of call and support today.    ONCBCN DISTRESS SCREENING 02/13/2015  Screening Type Initial Screening  Distress experienced in past week (1-10) 5  Practical problem type Food  Emotional problem type Depression;Nervousness/Anxiety;Adjusting to appearance changes  Physical Problem type Pain;Sleep/insomnia;Bathing/dressing;Breathing;Loss of appetitie;Tingling hands/feet  Physician notified of physical symptoms Yes  Referral to clinical social work Yes  Referral to palliative care Yes    Clinical Social Worker follow up needed: Yes.    If yes, follow up plan: See above Stuart Moore, [place] Worker [place]  [facility] Phone: [phone] Fax: [phone]

## 2015-02-26 ENCOUNTER — Ambulatory Visit
Admission: RE | Admit: 2015-02-26 | Discharge: 2015-02-26 | Disposition: A | Discharge status: Home / Self Care | Payer: Medicare Other | Source: Ambulatory Visit | Attending: Hematology and Oncology | Admitting: Hematology and Oncology

## 2015-02-26 ENCOUNTER — Telehealth: Payer: Self-pay | Admitting: Nutrition

## 2015-02-26 DIAGNOSIS — R160 Hepatomegaly, not elsewhere classified: Secondary | ICD-10-CM | POA: Insufficient documentation

## 2015-02-26 NOTE — Telephone Encounter (Signed)
Received a referral from the social worker that patient desired to speak to dietitian. Contacted patient by telephone and spoke with him and with his wife. Patient has been seeing a dietitian in [place] who is providing Glucerna through the [place] Currently patient and wife do not have any questions or needs for dietitian at this time. Encouraged patient/wife to contact me if they develop questions or concerns.  **Disclaimer: This note was dictated with voice recognition software. Similar sounding words can inadvertently be transcribed and this note may contain transcription errors which may not have been corrected upon publication of note.**
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
## 2015-03-04 ENCOUNTER — Telehealth: Payer: Self-pay | Admitting: Interventional Radiology

## 2015-03-04 NOTE — Progress Notes (Signed)
Discussed with Dr Benay Spice.  Agree with treating extrahepatic disease( radiation Rx to the rapidly enlarging abdominal nodes) over the small left hepatic lesion.  Will hold on hepatic ablation.  Have left voicemail with Dr. Merrie Roof with the [facility] D/w with the patient and his spouse by phone this morning.

## 2015-03-05 ENCOUNTER — Emergency Department (HOSPITAL_COMMUNITY)
Admission: EM | Admit: 2015-03-05 | Discharge: 2015-03-05 | Disposition: A | Discharge status: Home / Self Care | Payer: Medicare Other | Attending: Emergency Medicine | Admitting: Emergency Medicine

## 2015-03-05 ENCOUNTER — Telehealth: Payer: Self-pay | Admitting: *Deleted

## 2015-03-05 ENCOUNTER — Emergency Department (HOSPITAL_COMMUNITY): Payer: Medicare Other

## 2015-03-05 ENCOUNTER — Inpatient Hospital Stay
Admission: RE | Admit: 2015-03-05 | Discharge: 2015-03-05 | Disposition: A | Discharge status: Home / Self Care | Payer: Self-pay | Source: Ambulatory Visit | Attending: Interventional Radiology | Admitting: Interventional Radiology

## 2015-03-05 ENCOUNTER — Other Ambulatory Visit (HOSPITAL_COMMUNITY): Payer: Self-pay | Admitting: Interventional Radiology

## 2015-03-05 ENCOUNTER — Encounter (HOSPITAL_COMMUNITY): Payer: Self-pay | Admitting: Emergency Medicine

## 2015-03-05 DIAGNOSIS — C220 Liver cell carcinoma: Secondary | ICD-10-CM

## 2015-03-05 DIAGNOSIS — R109 Unspecified abdominal pain: Secondary | ICD-10-CM

## 2015-03-05 DIAGNOSIS — Z8719 Personal history of other diseases of the digestive system: Secondary | ICD-10-CM | POA: Insufficient documentation

## 2015-03-05 DIAGNOSIS — Z79899 Other long term (current) drug therapy: Secondary | ICD-10-CM | POA: Diagnosis not present

## 2015-03-05 DIAGNOSIS — Z794 Long term (current) use of insulin: Secondary | ICD-10-CM | POA: Diagnosis not present

## 2015-03-05 DIAGNOSIS — R000 Tachycardia, unspecified: Secondary | ICD-10-CM | POA: Diagnosis not present

## 2015-03-05 DIAGNOSIS — Z87442 Personal history of urinary calculi: Secondary | ICD-10-CM | POA: Diagnosis not present

## 2015-03-05 DIAGNOSIS — R1084 Generalized abdominal pain: Secondary | ICD-10-CM | POA: Insufficient documentation

## 2015-03-05 DIAGNOSIS — C787 Secondary malignant neoplasm of liver and intrahepatic bile duct: Secondary | ICD-10-CM | POA: Insufficient documentation

## 2015-03-05 DIAGNOSIS — E119 Type 2 diabetes mellitus without complications: Secondary | ICD-10-CM | POA: Diagnosis not present

## 2015-03-05 DIAGNOSIS — Z87891 Personal history of nicotine dependence: Secondary | ICD-10-CM | POA: Diagnosis not present

## 2015-03-05 DIAGNOSIS — I10 Essential (primary) hypertension: Secondary | ICD-10-CM | POA: Diagnosis not present

## 2015-03-05 DIAGNOSIS — Z9889 Other specified postprocedural states: Secondary | ICD-10-CM | POA: Diagnosis not present

## 2015-03-05 LAB — COMPREHENSIVE METABOLIC PANEL
ALT: 141 U/L — AB (ref 17–63)
AST: 195 U/L — ABNORMAL HIGH (ref 15–41)
Albumin: 3.9 g/dL (ref 3.5–5.0)
Alkaline Phosphatase: 223 U/L — ABNORMAL HIGH (ref 38–126)
Anion gap: 10 (ref 5–15)
BUN: 10 mg/dL (ref 6–20)
CHLORIDE: 96 mmol/L — AB (ref 101–111)
CO2: 27 mmol/L (ref 22–32)
Calcium: 9 mg/dL (ref 8.9–10.3)
Creatinine, Ser: 0.73 mg/dL (ref 0.61–1.24)
GFR calc non Af Amer: >60 mL/min (ref >60)
Glucose, Bld: 192 mg/dL — ABNORMAL HIGH (ref 65–99)
Potassium: 4.3 mmol/L (ref 3.5–5.1)
SODIUM: 133 mmol/L — AB (ref 135–145)
Total Bilirubin: 0.9 mg/dL (ref 0.3–1.2)
Total Protein: 8.1 g/dL (ref 6.5–8.1)

## 2015-03-05 LAB — CBC WITH DIFFERENTIAL/PLATELET
Basophils Absolute: 0 10*3/uL (ref 0.0–0.1)
Basophils Relative: 0 % (ref 0–1)
Eosinophils Absolute: 0.1 10*3/uL (ref 0.0–0.7)
Eosinophils Relative: 1 % (ref 0–5)
HCT: 36.4 % — ABNORMAL LOW (ref 39.0–52.0)
Hemoglobin: 11.7 g/dL — ABNORMAL LOW (ref 13.0–17.0)
LYMPHS ABS: 1.7 10*3/uL (ref 0.7–4.0)
LYMPHS PCT: 19 % (ref 12–46)
MCH: 27.2 pg (ref 26.0–34.0)
MCHC: 32.1 g/dL (ref 30.0–36.0)
MCV: 84.7 fL (ref 78.0–100.0)
Monocytes Absolute: 1 10*3/uL (ref 0.1–1.0)
Monocytes Relative: 11 % (ref 3–12)
Neutro Abs: 6.3 10*3/uL (ref 1.7–7.7)
Neutrophils Relative %: 69 % (ref 43–77)
Platelets: 292 10*3/uL (ref 150–400)
RBC: 4.3 MIL/uL (ref 4.22–5.81)
RDW: 13.9 % (ref 11.5–15.5)
WBC: 9.1 10*3/uL (ref 4.0–10.5)

## 2015-03-05 LAB — I-STAT CG4 LACTIC ACID, ED
Lactic Acid, Venous: 1.04 mmol/L (ref 0.5–2.0)
Lactic Acid, Venous: 0.73 mmol/L (ref 0.5–2.0)

## 2015-03-05 LAB — LIPASE, BLOOD: LIPASE: 21 U/L — AB (ref 22–51)

## 2015-03-05 MED ORDER — HYDROMORPHONE HCL 1 MG/ML IJ SOLN
1.0000 mg | Freq: Once | INTRAMUSCULAR | Status: AC
  Administered 2015-03-05: 1 mg via INTRAVENOUS
  Filled 2015-03-05: qty 1

## 2015-03-05 MED ORDER — MORPHINE SULFATE 4 MG/ML IJ SOLN
4.0000 mg | Freq: Once | INTRAMUSCULAR | Status: AC
  Administered 2015-03-05: 4 mg via INTRAVENOUS
  Filled 2015-03-05: qty 1

## 2015-03-05 MED ORDER — IOHEXOL 300 MG/ML  SOLN
100.0000 mL | Freq: Once | INTRAMUSCULAR | Status: AC | PRN
  Administered 2015-03-05: 100 mL via INTRAVENOUS

## 2015-03-05 MED ORDER — IOHEXOL 300 MG/ML  SOLN
25.0000 mL | Freq: Once | INTRAMUSCULAR | Status: AC | PRN
  Administered 2015-03-05: 25 mL via ORAL

## 2015-03-05 NOTE — ED Notes (Signed)
EDPA aware pt's pain unchanged.

## 2015-03-05 NOTE — ED Notes (Signed)
Pt to CT at this time via stretcher

## 2015-03-05 NOTE — Telephone Encounter (Signed)
Oncology Nurse Navigator Documentation  Oncology Nurse Navigator Flowsheets 03/05/2015  Navigator Encounter Type Telephone  Patient Visit Type -  Treatment Phase -Treatment planning  Barriers/Navigation Needs -  Interventions -  Coordination of Care -  Time Spent with Patient (Retired) -  Event organiser with contact at [employer] and made her aware that radiologist in IR agrees with Dr. Benay Spice that patient needs radiation oncology to begin treatment to enlarging abdominal nodes that are causing him pain. Liver ablation is not necessary. Need this communicated to Dr. Merrie Roof so authorization to treat can be obtained asap. There has already been a delay in care due to [place]. Phillis Haggis will call case manager and discuss and return call.

## 2015-03-05 NOTE — Discharge Instructions (Signed)

## 2015-03-05 NOTE — ED Notes (Signed)
Pt has stage IV liver cancer and is having worsening pain in his abdomen. Tried morphine and oxy pills at home w/o relief. Alert and oriented. Scheduled to start radiation tomorrow w/ Dr. Lisbeth Renshaw.
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
## 2015-03-06 ENCOUNTER — Telehealth: Payer: Self-pay | Admitting: *Deleted

## 2015-03-06 ENCOUNTER — Ambulatory Visit
Admission: RE | Admit: 2015-03-06 | Discharge: 2015-03-06 | Disposition: A | Discharge status: Home / Self Care | Payer: Non-veteran care | Source: Ambulatory Visit | Attending: Radiation Oncology | Admitting: Radiation Oncology

## 2015-03-06 ENCOUNTER — Encounter: Payer: Self-pay | Admitting: *Deleted

## 2015-03-06 ENCOUNTER — Ambulatory Visit: Payer: Medicare Other | Admitting: Nurse Practitioner

## 2015-03-06 DIAGNOSIS — C220 Liver cell carcinoma: Secondary | ICD-10-CM | POA: Diagnosis not present

## 2015-03-06 NOTE — Telephone Encounter (Addendum)
Patient in cancer center lobby asking if he needs to be seen by Elby Showers. Stuart Moores, NP.  Patient states he was in the ED last night for abdominal pain and just seen Radiation doctor.  Patient also states that pain is relieved by pain medication and rest, pain is worse when moving around.  Per Elby Showers. Stuart Moores, NP.  Patient does not need to stay for appointment today if he does not want to but will need to call and reschedule.  Patient verbalized understanding.

## 2015-03-06 NOTE — Progress Notes (Signed)
Spoke with Dr. Benay Spice regarding CT SIM today and starts RT on 03/13/15. When does he need f/u with medical oncology. Will see at end of RT-POF to scheduler.

## 2015-03-07 ENCOUNTER — Telehealth: Payer: Self-pay | Admitting: Oncology

## 2015-03-07 NOTE — Telephone Encounter (Signed)
s.w. pt and advised on Aug appt....pt ok and aware °

## 2015-03-07 NOTE — Progress Notes (Signed)
  Radiation Oncology         [phone] ________________________________  Name: Stuart Moore MRN: [id number]  Date: 03/06/2015  DOB: November 06, 1955  SIMULATION AND TREATMENT PLANNING NOTE  DIAGNOSIS:  Metastatic hepatocellular carcinoma   Site:   1. Left neck 2.  Upper abdominal lymphadenopathy   NARRATIVE:  The patient was brought to the [facility].  Identity was confirmed.  All relevant records and images related to the planned course of therapy were reviewed.   Written consent to proceed with treatment was confirmed which was freely given after reviewing the details related to the planned course of therapy had been reviewed with the patient.  Then, the patient was set-up in a stable reproducible  supine position for radiation therapy.  CT images were obtained.  Surface markings were placed.    Medically necessary complex treatment device(s) for immobilization:   1.   Customized thermoplastic head chest 2.  Customized vac lock bag  The CT images were loaded into the planning software.  Then the target and avoidance structures were contoured.  Treatment planning then occurred.  The radiation prescription was entered and confirmed.  A total of 6 complex treatment devices were fabricated which relate to the designed radiation treatment fields: 2 fields to treat the left neck and for fields to treat the upper abdomen which represent separate target areas. Each of these customized fields/ complex treatment devices will be used on a daily basis during the radiation course. I have requested : 3D Simulation  I have requested a DVH of the following structures: Target volume, spinal cord, kidneys bilaterally.   The patient will undergo daily image guidance to ensure accurate localization of the target, and adequate minimize dose to the normal surrounding structures in close proximity to the target.   PLAN:  The patient will receive 30 Gy in 10 fractions to both of these 2  target regions.  ________________________________   Jodelle Gross, MD, PhD

## 2015-03-08 DIAGNOSIS — C220 Liver cell carcinoma: Secondary | ICD-10-CM | POA: Diagnosis not present

## 2015-03-13 ENCOUNTER — Ambulatory Visit
Admission: RE | Admit: 2015-03-13 | Discharge: 2015-03-13 | Disposition: A | Discharge status: Home / Self Care | Payer: Non-veteran care | Source: Ambulatory Visit | Attending: Radiation Oncology | Admitting: Radiation Oncology

## 2015-03-13 ENCOUNTER — Ambulatory Visit
Admission: RE | Admit: 2015-03-13 | Discharge: 2015-03-13 | Disposition: A | Discharge status: Home / Self Care | Payer: Medicare Other | Source: Ambulatory Visit | Attending: Radiation Oncology | Admitting: Radiation Oncology

## 2015-03-13 DIAGNOSIS — C220 Liver cell carcinoma: Secondary | ICD-10-CM

## 2015-03-13 MED ORDER — BIAFINE EX EMUL
CUTANEOUS | Status: DC | PRN
  Administered 2015-03-13: 11:00:00 via TOPICAL

## 2015-03-13 NOTE — Progress Notes (Signed)
SKIN CARE DURING RADIATION TREATMENT-HEAD AND NECK  RECOMMENDATIONS: ? Use unscented soap (Dove) ? When showering it is fine for water to touch the area, but please avoid direct spray on the treatment field.  Also, wash inside and around the marked area ? When drying gently blot the area ? Avoid using lotions, oils or powders as well as products with alcohol ? If you shave, use an electric razor and DO NOT use pre or after shave lotion  ? Moisturizer o You may be given Radiaplex Gel or Biafine (provided by nursing) to use. Apply twice daily, once after treatment and then again prior to bedtime o Your Radiation Oncologist may suggest other skin care products ? PLEASE DO NOT APPLY ANYTHING TO THE TREATMENT AREA SKIN WITH 4 HOURS  PRIOR TO RADIATION  ? Mouth care o Soothing relief: rinse your mouth every 1-2 hours with a solution of  teaspoon baking soda and 1/8 teaspoon salt mixed in 1 cup of warm water o DO NOT use mouthwashes that contain alcohol, try using BIOTENE instead                       

## 2015-03-14 ENCOUNTER — Ambulatory Visit
Admission: RE | Admit: 2015-03-14 | Discharge: 2015-03-14 | Disposition: A | Discharge status: Home / Self Care | Payer: Non-veteran care | Source: Ambulatory Visit | Attending: Radiation Oncology | Admitting: Radiation Oncology

## 2015-03-14 DIAGNOSIS — C220 Liver cell carcinoma: Secondary | ICD-10-CM | POA: Diagnosis not present

## 2015-03-15 ENCOUNTER — Encounter: Payer: Self-pay | Admitting: Radiation Oncology

## 2015-03-15 ENCOUNTER — Ambulatory Visit
Admission: RE | Admit: 2015-03-15 | Discharge: 2015-03-15 | Disposition: A | Discharge status: Home / Self Care | Payer: Medicare Other | Source: Ambulatory Visit | Attending: Radiation Oncology | Admitting: Radiation Oncology

## 2015-03-15 ENCOUNTER — Ambulatory Visit
Admission: RE | Admit: 2015-03-15 | Discharge: 2015-03-15 | Disposition: A | Discharge status: Home / Self Care | Payer: Non-veteran care | Source: Ambulatory Visit | Attending: Radiation Oncology | Admitting: Radiation Oncology

## 2015-03-15 VITALS — BP 154/86 | HR 81 | Temp 98.3°F | Resp 20 | Wt 169.1 lb

## 2015-03-15 DIAGNOSIS — C220 Liver cell carcinoma: Secondary | ICD-10-CM

## 2015-03-15 NOTE — Progress Notes (Signed)
  Radiation Oncology         [phone]   Name: Stuart Moore MRN: [id number]   Date: 03/15/2015  DOB: [birth year]     Weekly Radiation Therapy Management    ICD-9-CM ICD-10-CM   1. Hepatocellular carcinoma 155.0 C22.0     Current Dose: 12 Gy  Planned Dose:  30 Gy  Narrative The patient presents for routine under treatment assessment. Weekly rad txs left neck/abdomen, pain a 4/10 scale took pain med 2 hours ago, queezy in abdomen , had eggs and bacon this am, no skin changes, has biafine to use when skin becomes irritated or itching , energy level poor, poor appetite, drinks glucerna shakes 2 day. Reports some nausea. Denies vomiting.  The patient is without complaint. Set-up films were reviewed. The chart was checked.  Physical Findings  weight is 169 lb 1.6 oz (76.703 kg). His oral temperature is 98.3 F (36.8 C). His blood pressure is 154/86 and his pulse is 81. His respiration is 20. . Weight essentially stable.  No significant changes.  Impression The patient is tolerating radiation.  Plan Continue treatment as planned.   This document serves as a record of services personally performed by Tyler Pita, MD. It was created on his behalf by Arlyce Harman, a trained medical scribe. The creation of this record is based on the scribe's personal observations and the provider's statements to them. This document has been checked and approved by the attending provider.      Sheral Apley Tammi Klippel, M.D.

## 2015-03-15 NOTE — Progress Notes (Addendum)
Weekly rad txs left neck/abdomen, pain a 4/10 scale took pain med 2 hours ago, queezy in abdomen , had eggs and bacon this am, no skin changes, has biafine to use when skin becomes irritated or itching , energy level poor, drinks glucerna shakes 2 day,   2:42 PM BP 154/86 mmHg  Pulse 81  Temp(Src) 98.3 F (36.8 C) (Oral)  Resp 20  Wt 169 lb 1.6 oz (76.703 kg)  Wt Readings from Last 3 Encounters:  03/15/15 169 lb 1.6 oz (76.703 kg)  02/26/15 169 lb (76.658 kg)  02/13/15 169 lb (76.658 kg)

## 2015-03-18 ENCOUNTER — Ambulatory Visit
Admission: RE | Admit: 2015-03-18 | Discharge: 2015-03-18 | Disposition: A | Discharge status: Home / Self Care | Payer: Non-veteran care | Source: Ambulatory Visit | Attending: Radiation Oncology | Admitting: Radiation Oncology

## 2015-03-18 DIAGNOSIS — C220 Liver cell carcinoma: Secondary | ICD-10-CM | POA: Diagnosis not present

## 2015-03-19 ENCOUNTER — Ambulatory Visit
Admission: RE | Admit: 2015-03-19 | Discharge: 2015-03-19 | Disposition: A | Discharge status: Home / Self Care | Payer: Non-veteran care | Source: Ambulatory Visit | Attending: Radiation Oncology | Admitting: Radiation Oncology

## 2015-03-19 DIAGNOSIS — C220 Liver cell carcinoma: Secondary | ICD-10-CM | POA: Diagnosis not present

## 2015-03-20 ENCOUNTER — Ambulatory Visit
Admission: RE | Admit: 2015-03-20 | Discharge: 2015-03-20 | Disposition: A | Discharge status: Home / Self Care | Payer: Non-veteran care | Source: Ambulatory Visit | Attending: Radiation Oncology | Admitting: Radiation Oncology

## 2015-03-20 DIAGNOSIS — C220 Liver cell carcinoma: Secondary | ICD-10-CM | POA: Diagnosis not present

## 2015-03-21 ENCOUNTER — Ambulatory Visit
Admission: RE | Admit: 2015-03-21 | Discharge: 2015-03-21 | Disposition: A | Discharge status: Home / Self Care | Payer: Non-veteran care | Source: Ambulatory Visit | Attending: Radiation Oncology | Admitting: Radiation Oncology

## 2015-03-21 DIAGNOSIS — C220 Liver cell carcinoma: Secondary | ICD-10-CM | POA: Diagnosis not present

## 2015-03-22 ENCOUNTER — Ambulatory Visit
Admission: RE | Admit: 2015-03-22 | Discharge: 2015-03-22 | Disposition: A | Discharge status: Home / Self Care | Payer: Medicare Other | Source: Ambulatory Visit | Attending: Radiation Oncology | Admitting: Radiation Oncology

## 2015-03-22 ENCOUNTER — Ambulatory Visit
Admission: RE | Admit: 2015-03-22 | Discharge: 2015-03-22 | Disposition: A | Discharge status: Home / Self Care | Payer: Non-veteran care | Source: Ambulatory Visit | Attending: Radiation Oncology | Admitting: Radiation Oncology

## 2015-03-22 ENCOUNTER — Encounter: Payer: Self-pay | Admitting: Radiation Oncology

## 2015-03-22 VITALS — BP 133/78 | HR 95 | Temp 97.6°F | Resp 20 | Wt 166.5 lb

## 2015-03-22 DIAGNOSIS — C220 Liver cell carcinoma: Secondary | ICD-10-CM | POA: Diagnosis not present

## 2015-03-22 NOTE — Progress Notes (Signed)
Department of Radiation Oncology  Phone:  [phone] Fax:        [phone]  Weekly Treatment Note    Name: Stuart Moore Date: 03/22/2015 MRN: [id number] DOB: 12/14/1955   Current dose: 24 Gy  Current fraction: 8   MEDICATIONS: Current Outpatient Prescriptions  Medication Sig Dispense Refill  . amLODipine (NORVASC) 10 MG tablet Take 10 mg by mouth daily.    Marland Kitchen ENSURE PLUS (ENSURE PLUS) LIQD Take 237 mLs by mouth 3 (three) times daily between meals.    . ferrous fumarate (HEMOCYTE - 106 MG FE) 325 (106 FE) MG TABS tablet Take 1 tablet by mouth 2 (two) times daily.    . insulin glargine (LANTUS) 100 UNIT/ML injection Inject 32 Units into the skin daily as needed (for blood sugar, anytime over 200-220 pt takes insulin). Sliding scale    . lisinopril (PRINIVIL,ZESTRIL) 40 MG tablet Take 40 mg by mouth every morning.     . Morphine Sulfate Beads 45 MG CP24 Take 45 mg by mouth 2 (two) times daily.    Marland Kitchen omeprazole (PRILOSEC) 20 MG capsule Take 20 mg by mouth daily.    . Oxycodone HCl 10 MG TABS Take 1-2 tabs every 6 hours as needed for pain (Patient taking differently: Take 10-20 mg by mouth every 6 (six) hours as needed (pain). ) 80 tablet 0  . polyethylene glycol (MIRALAX / GLYCOLAX) packet Take 17 g by mouth daily. (Patient taking differently: Take 17 g by mouth daily as needed. ) 30 each 0  . propranolol (INDERAL) 20 MG tablet Take 20 mg by mouth daily.     . traZODone (DESYREL) 50 MG tablet Take 50 mg by mouth at bedtime as needed for sleep.    Marland Kitchen emollient (BIAFINE) cream Apply 1 application topically daily.     No current facility-administered medications for this encounter.     ALLERGIES: Bee venom   LABORATORY DATA:  Lab Results  Component Value Date   WBC 9.1 03/05/2015   HGB 11.7* 03/05/2015   HCT 36.4* 03/05/2015   MCV 84.7 03/05/2015   PLT 292 03/05/2015   Lab Results  Component Value Date   NA 133* 03/05/2015   K 4.3 03/05/2015   CL 96*  03/05/2015   CO2 27 03/05/2015   Lab Results  Component Value Date   ALT 141* 03/05/2015   AST 195* 03/05/2015   ALKPHOS 223* 03/05/2015   BILITOT 0.9 03/05/2015     NARRATIVE: Stuart Moore was seen today for weekly treatment management. The chart was checked and the patient's films were reviewed. Has not seen any skin changes. The pt hasn't used biafine as of yet. Pt complains of abdominal pain of 4/10 scale and feels bloated. Describes generiazed pain in the back and abdomen. Reports fatigue. Reports that he was nauseated yesterday and threw up for the first time. Reports he already has medication for nausea  PHYSICAL EXAMINATION: weight is 166 lb 8 oz (75.524 kg). His oral temperature is 97.6 F (36.4 C). His blood pressure is 133/78 and his pulse is 95. His respiration is 20 and oxygen saturation is 99%.  Alert and oriented times three. Mild hyperpigmentation noted in the left neck.  ASSESSMENT: The patient is doing satisfactorily with treatment.  PLAN: We will continue with the patient's radiation treatment as planned.  This document serves as a record of services personally performed by Kyung Rudd, MD. It was created on his behalf by Darcus Austin, a trained medical scribe.  The creation of this record is based on the scribe's personal observations and the provider's statements to them. This document has been checked and approved by the attending provider.     ________________________________   Jodelle Gross, MD, PhD

## 2015-03-22 NOTE — Progress Notes (Signed)
Weekly rad tx head/neck & abdomen, no skin changes, hasn't used biafine as yet, c/o abdominal pain 4/10 scale, a feels bloated, and pain, generiazed pain in back, andomen,  Very fatigued, nauseated yesterday and threw up for the first time,  3:14 PM BP 133/78 mmHg  Pulse 95  Temp(Src) 97.6 F (36.4 C) (Oral)  Resp 20  Wt 166 lb 8 oz (75.524 kg)  SpO2 99%  Wt Readings from Last 3 Encounters:  03/22/15 166 lb 8 oz (75.524 kg)  03/15/15 169 lb 1.6 oz (76.703 kg)  02/26/15 169 lb (76.658 kg)

## 2015-03-25 ENCOUNTER — Ambulatory Visit
Admission: RE | Admit: 2015-03-25 | Discharge: 2015-03-25 | Disposition: A | Discharge status: Home / Self Care | Payer: Non-veteran care | Source: Ambulatory Visit | Attending: Radiation Oncology | Admitting: Radiation Oncology

## 2015-03-25 DIAGNOSIS — C220 Liver cell carcinoma: Secondary | ICD-10-CM | POA: Diagnosis not present

## 2015-03-26 ENCOUNTER — Ambulatory Visit
Admission: RE | Admit: 2015-03-26 | Discharge: 2015-03-26 | Disposition: A | Discharge status: Home / Self Care | Payer: Non-veteran care | Source: Ambulatory Visit | Attending: Radiation Oncology | Admitting: Radiation Oncology

## 2015-03-26 ENCOUNTER — Encounter: Payer: Self-pay | Admitting: Radiation Oncology

## 2015-03-26 DIAGNOSIS — C220 Liver cell carcinoma: Secondary | ICD-10-CM

## 2015-03-26 NOTE — Progress Notes (Addendum)
Weekly Management Note:  Site: Left neck and abdomen Current Dose:  3000/3000  cGy Projected Dose: 3000/3000  cGy  Narrative: The patient is seen today for routine under treatment assessment. CBCT/MVCT images/port films were reviewed. The chart was reviewed.   He finishes his radiation therapy today in the management of his metastatic hepatocellular carcinoma.  He does report pain on swallowing.  He states that his pain is 4/10.  He is having less abdominal discomfort.  His weight is down almost 3 pounds over the past week.  Physical Examination: There were no vitals filed for this visit..  Weight:  .  There is a palpable mass along the medial left supra-clavicular region with slight hyperpigmentation.  Impression: Radiation therapy well tolerated except for radiation esophagitis.  This should improve the near future.  He may take his current pain medications including oxycodone as needed for breakthrough pain along with his morphine sulfate  Beads.   Plan: Continue radiation therapy as planned.

## 2015-04-01 ENCOUNTER — Telehealth: Payer: Self-pay | Admitting: Nurse Practitioner

## 2015-04-01 ENCOUNTER — Ambulatory Visit: Payer: Medicare Other | Admitting: Nurse Practitioner

## 2015-04-01 NOTE — Telephone Encounter (Signed)
Pt's wife call to r/s due to pt is sick, confirmed MD visit .... kJ

## 2015-04-09 ENCOUNTER — Ambulatory Visit (HOSPITAL_BASED_OUTPATIENT_CLINIC_OR_DEPARTMENT_OTHER): Payer: Medicare Other | Admitting: Nurse Practitioner

## 2015-04-09 ENCOUNTER — Telehealth: Payer: Self-pay | Admitting: Nurse Practitioner

## 2015-04-09 ENCOUNTER — Encounter: Payer: Self-pay | Admitting: *Deleted

## 2015-04-09 VITALS — BP 107/67 | HR 72 | Temp 98.4°F | Resp 18 | Ht 67.0 in | Wt 165.3 lb

## 2015-04-09 DIAGNOSIS — G893 Neoplasm related pain (acute) (chronic): Secondary | ICD-10-CM

## 2015-04-09 DIAGNOSIS — C772 Secondary and unspecified malignant neoplasm of intra-abdominal lymph nodes: Secondary | ICD-10-CM | POA: Diagnosis not present

## 2015-04-09 DIAGNOSIS — C220 Liver cell carcinoma: Secondary | ICD-10-CM | POA: Diagnosis present

## 2015-04-09 DIAGNOSIS — E119 Type 2 diabetes mellitus without complications: Secondary | ICD-10-CM | POA: Diagnosis not present

## 2015-04-09 DIAGNOSIS — C770 Secondary and unspecified malignant neoplasm of lymph nodes of head, face and neck: Secondary | ICD-10-CM

## 2015-04-09 DIAGNOSIS — H5441 Blindness, right eye, normal vision left eye: Secondary | ICD-10-CM

## 2015-04-09 MED ORDER — OXYCODONE HCL 10 MG PO TABS: ORAL_TABLET | ORAL | Status: DC

## 2015-04-09 NOTE — Progress Notes (Addendum)
  [place] OFFICE PROGRESS NOTE   Diagnosis:  Hepatocellular carcinoma  INTERVAL HISTORY:   [name] returns for follow-up. He initially noted improvement in abdominal pain following the course of radiation. However, over the past 3-4 days he has had increased abdominal pain, nausea, "sweats and shakes". He had a temperature 102 last night. He has stable dyspnea on exertion. No cough. He has intermittent. No urinary symptoms. He was constipated yesterday. No diarrhea. He continues MS Contin with oxycodone as needed. No fever today. The abdominal pain over the past few days feels different than the pain he was experiencing initially.  Objective:  Vital signs in last 24 hours:  Blood pressure 107/67, pulse 72, temperature 98.4 F (36.9 C), temperature source Oral, resp. rate 18, height 5\' 7"  (1.702 m), weight 165 lb 4.8 oz (74.98 kg), SpO2 96 %.  HEENT: 2 cm firm mass left supra clavicular fossa. Resp: Lungs clear bilaterally. Cardio: Regular rate and rhythm. GI: Mass mid upper abdomen with associated tenderness. Vascular: No leg edema.    Lab Results:  Lab Results  Component Value Date   WBC 9.1 03/05/2015   HGB 11.7* 03/05/2015   HCT 36.4* 03/05/2015   MCV 84.7 03/05/2015   PLT 292 03/05/2015   NEUTROABS 6.3 03/05/2015    Imaging:  No results found.  Medications: I have reviewed the patient's current medications.  Assessment/Plan: 1. Hepatocellular carcinoma  Status post microwave ablation of a liver lesion I 7 2015  Biopsy of a periportal lymph node 06/07/2014 confirmed metastatic hepatocellular carcinoma  Sorafenib started November 2015  CT 12/20/2014 with an enlarged left supraclavicular node, progressive bulky upper abdominal lymphadenopathy, stable apparent ablation defect in segment 8 of the liver, stable hyperenhancing and in segment 7 of the liver  Biopsy of the left supraclavicular lymph node on 01/03/2015 confirmed metastatic  hepatocellular carcinoma  CT 02/11/2015 at the [facility] revealed progressive lymphadenopathy in the upper abdomen and left supraclavicular regions, a new 2.4 cm mass in segment 3, and possible changes of bland tumor thrombus or portal vein/hepatic vein thrombus  Status post radiation to the left neck and abdomen 03/13/2015 through 03/26/2015   2. Hepatitis C/cirrhosis-treated successfully for hepatitis C infection at the [facility]  3. Diabetes  4. Pain secondary to #1  5. Right eye blindness   Disposition: Stuart Moore has completed the course of palliative radiation. He has noted some improvement in the pain. He will continue MS Contin with oxycodone as needed. The recent fever may be "tumor fever". He will contact the office if he continues to have fevers or develops any symptoms of an infection. He will return for a follow-up visit on 04/23/2015.  Patient seen with Dr. Benay Spice.    Ned Card ANP/GNP-BC   04/09/2015  2:26 PM  This was a shared visit with Ned Card. Stuart Moore was interviewed and examined. He is scheduled to be seen at the [place] next week. He will return for an office visit here in 2 weeks to discuss clinical trials and systemic treatment options.  Julieanne Manson, M.D.

## 2015-04-09 NOTE — Telephone Encounter (Signed)
per pof to sch pt appt-gave pt copy of avs °

## 2015-04-09 NOTE — Progress Notes (Signed)
Oncology Nurse Navigator Documentation  Oncology Nurse Navigator Flowsheets 04/09/2015  Navigator Encounter Type 3 month  Patient Visit Type Medonc  Treatment Phase Treatment--RT completed  Barriers/Navigation Needs No barriers at this time--pain is slightly improved.  Time Spent with Patient 5  Met with patient and his wife after his office visit today. Listened and provided encouragement.

## 2015-04-10 NOTE — Progress Notes (Signed)
  Radiation Oncology         [phone] ________________________________  Name: Stuart Moore MRN: [id number]  Date: 03/26/2015  DOB: 06-05-1956  End of Treatment Note  Diagnosis:   Metastatic hepatocellular carcinoma     Indication for treatment::  palliative       Radiation treatment dates:   03/13/2015 through 03/26/2015  Site/dose:   The patient was treated to an area of bulky abdominal lymphadenopathy in addition to a area of lymphadenopathy within the left neck. The patient was treated concurrently these 2 separate target regions to a dose of 30 gray in 10 fractions at 3 gray per fraction. This consisted of a 3-D conformal technique. Daily image guidance was utilized during this course of treatment to ensure accurate localization of the target.  Narrative: The patient tolerated radiation treatment relatively well.     Plan: The patient has completed radiation treatment. The patient will return to radiation oncology clinic for routine followup in one month. I advised the patient to call or return sooner if they have any questions or concerns related to their recovery or treatment. ________________________________  Jodelle Gross, M.D., Ph.D.

## 2015-04-17 ENCOUNTER — Encounter: Payer: Self-pay | Admitting: Radiation Oncology

## 2015-04-17 ENCOUNTER — Ambulatory Visit
Admission: RE | Admit: 2015-04-17 | Discharge: 2015-04-17 | Disposition: A | Discharge status: Home / Self Care | Payer: Medicare Other | Source: Ambulatory Visit | Attending: Radiation Oncology | Admitting: Radiation Oncology

## 2015-04-17 VITALS — BP 116/75 | HR 89 | Temp 98.5°F | Resp 20 | Ht 67.0 in | Wt 161.7 lb

## 2015-04-17 DIAGNOSIS — C220 Liver cell carcinoma: Secondary | ICD-10-CM

## 2015-04-17 NOTE — Progress Notes (Signed)
Radiation Oncology         [phone] ________________________________  Name: Stuart Moore MRN: [id number]  Date: 04/17/2015  DOB: [birth year]  Follow-Up Visit Note  CC: [facility]  Ladell Pier, MD  Diagnosis:   Metastatic hepatocellular carcinoma  Interval Since Last Radiation:  3 weeks   Narrative:  The patient returns today for routine follow-up.  Follow up s/p rad txs 03/13/15-03/26/15. Left neck, and abdomen, skin well healed, no c/o nausea, C/o sore stomach. Drinks 2-3 ensures in between small meals throughout the day. Reports no appetite. Pain 2/10 scale at present. He feels as though he has an upset stomach. He denies any pain in his neck. Takes miralax q other day to keep bowels moving regular, energy level still poor. Sees Dr. Merrie Roof at the [facility]. He is currently out of his anti nausea medication but plans to have them refilled by Dr. Merrie Roof. Sees medical oncology with Dr. Learta Codding, 8/30.                               ALLERGIES:  is allergic to bee venom.  Meds: Current Outpatient Prescriptions  Medication Sig Dispense Refill  . amLODipine (NORVASC) 10 MG tablet Take 10 mg by mouth daily.    Marland Kitchen ENSURE PLUS (ENSURE PLUS) LIQD Take 237 mLs by mouth 3 (three) times daily between meals.    . ferrous fumarate (HEMOCYTE - 106 MG FE) 325 (106 FE) MG TABS tablet Take 1 tablet by mouth 2 (two) times daily.    . insulin glargine (LANTUS) 100 UNIT/ML injection Inject 32 Units into the skin daily as needed (for blood sugar, anytime over 200-220 pt takes insulin). Sliding scale    . lisinopril (PRINIVIL,ZESTRIL) 40 MG tablet Take 40 mg by mouth every morning.     . Morphine Sulfate Beads 45 MG CP24 Take 45 mg by mouth 2 (two) times daily.    Marland Kitchen omeprazole (PRILOSEC) 20 MG capsule Take 20 mg by mouth daily.    . Oxycodone HCl 10 MG TABS Take 1-2 tabs every 6 hours as needed for pain 80 tablet 0  . polyethylene glycol (MIRALAX / GLYCOLAX) packet  Take 17 g by mouth daily. (Patient taking differently: Take 17 g by mouth daily as needed. ) 30 each 0  . propranolol (INDERAL) 20 MG tablet Take 20 mg by mouth daily.     . traZODone (DESYREL) 50 MG tablet Take 50 mg by mouth at bedtime as needed for sleep.    Marland Kitchen emollient (BIAFINE) cream Apply 1 application topically daily.     No current facility-administered medications for this encounter.    Physical Findings: The patient is in no acute distress. Patient is alert and oriented.  height is 5\' 7"  (1.702 m) and weight is 161 lb 11.2 oz (73.347 kg). His oral temperature is 98.5 F (36.9 C). His blood pressure is 116/75 and his pulse is 89. His respiration is 20. Marland Kitchen   Left neck adenopathy slightly smaller. No desquamation present.  Lab Findings: Lab Results  Component Value Date   WBC 9.1 03/05/2015   HGB 11.7* 03/05/2015   HCT 36.4* 03/05/2015   MCV 84.7 03/05/2015   PLT 292 03/05/2015     Radiographic Findings: No results found.  Impression:    Patient appears to be doing relatively well today without any apparent difficulties form radiation treatment.   Plan:  Patient will be  seen at the [facility] in [place] with Dr. Merrie Roof tomorrow. He is scheduled to follow up with medical oncology on 8/30. We will follow up in 4 months.   This document serves as a record of services personally performed by Kyung Rudd, MD. It was created on his behalf by Arlyce Harman, a trained medical scribe. The creation of this record is based on the scribe's personal observations and the provider's statements to them. This document has been checked and approved by the attending provider.  Jodelle Gross, M.D., Ph.D.

## 2015-04-17 NOTE — Progress Notes (Addendum)
Follow up s/p rad txs  03/13/15-03/26/15,  Left neck, and abdomen, skin well healed,  no c/o nausea,  C/o sore stomach,  Drinks 2-3 ensures in between small meals throughout the day no appetite, pain  2/10 scale at present, takes miralax q other day to keep bowels moving regular, energy level still poor , ses Dr. Merrie Roof at the [facility] tomorrow  At [facility] 10:22 AM BP 116/75 mmHg  Pulse 89  Temp(Src) 98.5 F (36.9 C) (Oral)  Resp 20  Ht 5\' 7"  (1.702 m)  Wt 161 lb 11.2 oz (73.347 kg)  BMI 25.32 kg/m2  Wt Readings from Last 3 Encounters:  04/17/15 161 lb 11.2 oz (73.347 kg)  04/09/15 165 lb 4.8 oz (74.98 kg)  03/22/15 166 lb 8 oz (75.524 kg)

## 2015-04-23 ENCOUNTER — Ambulatory Visit: Payer: Medicare Other | Admitting: Nurse Practitioner

## 2015-04-25 ENCOUNTER — Encounter (HOSPITAL_COMMUNITY): Payer: Self-pay

## 2015-04-25 ENCOUNTER — Emergency Department (HOSPITAL_COMMUNITY): Payer: Medicare Other

## 2015-04-25 ENCOUNTER — Emergency Department (HOSPITAL_COMMUNITY)
Admission: EM | Admit: 2015-04-25 | Discharge: 2015-04-25 | Disposition: A | Discharge status: Home / Self Care | Payer: Medicare Other | Attending: Emergency Medicine | Admitting: Emergency Medicine

## 2015-04-25 DIAGNOSIS — Z794 Long term (current) use of insulin: Secondary | ICD-10-CM | POA: Diagnosis not present

## 2015-04-25 DIAGNOSIS — E119 Type 2 diabetes mellitus without complications: Secondary | ICD-10-CM | POA: Insufficient documentation

## 2015-04-25 DIAGNOSIS — C220 Liver cell carcinoma: Secondary | ICD-10-CM

## 2015-04-25 DIAGNOSIS — Z87828 Personal history of other (healed) physical injury and trauma: Secondary | ICD-10-CM | POA: Insufficient documentation

## 2015-04-25 DIAGNOSIS — R1084 Generalized abdominal pain: Secondary | ICD-10-CM | POA: Diagnosis not present

## 2015-04-25 DIAGNOSIS — Z87891 Personal history of nicotine dependence: Secondary | ICD-10-CM | POA: Insufficient documentation

## 2015-04-25 DIAGNOSIS — Z87442 Personal history of urinary calculi: Secondary | ICD-10-CM | POA: Insufficient documentation

## 2015-04-25 DIAGNOSIS — I10 Essential (primary) hypertension: Secondary | ICD-10-CM | POA: Insufficient documentation

## 2015-04-25 DIAGNOSIS — Z8719 Personal history of other diseases of the digestive system: Secondary | ICD-10-CM | POA: Diagnosis not present

## 2015-04-25 DIAGNOSIS — R509 Fever, unspecified: Secondary | ICD-10-CM | POA: Diagnosis present

## 2015-04-25 DIAGNOSIS — Z79899 Other long term (current) drug therapy: Secondary | ICD-10-CM | POA: Diagnosis not present

## 2015-04-25 LAB — CBC WITH DIFFERENTIAL/PLATELET
Basophils Absolute: 0 10*3/uL (ref 0.0–0.1)
Basophils Relative: 0 % (ref 0–1)
Eosinophils Absolute: 0 10*3/uL (ref 0.0–0.7)
Eosinophils Relative: 0 % (ref 0–5)
HCT: 36.1 % — ABNORMAL LOW (ref 39.0–52.0)
Hemoglobin: 12 g/dL — ABNORMAL LOW (ref 13.0–17.0)
LYMPHS ABS: 0.7 10*3/uL (ref 0.7–4.0)
LYMPHS PCT: 5 % — AB (ref 12–46)
MCH: 27.1 pg (ref 26.0–34.0)
MCHC: 33.2 g/dL (ref 30.0–36.0)
MCV: 81.5 fL (ref 78.0–100.0)
MONO ABS: 1.6 10*3/uL — AB (ref 0.1–1.0)
MONOS PCT: 12 % (ref 3–12)
NEUTROS ABS: 11 10*3/uL — AB (ref 1.7–7.7)
Neutrophils Relative %: 83 % — ABNORMAL HIGH (ref 43–77)
Platelets: 288 10*3/uL (ref 150–400)
RBC: 4.43 MIL/uL (ref 4.22–5.81)
RDW: 15.2 % (ref 11.5–15.5)
WBC: 13.3 10*3/uL — ABNORMAL HIGH (ref 4.0–10.5)

## 2015-04-25 LAB — AMMONIA: Ammonia: 30 umol/L (ref 9–35)

## 2015-04-25 LAB — URINALYSIS, ROUTINE W REFLEX MICROSCOPIC
BILIRUBIN URINE: NEGATIVE
Glucose, UA: >1000 mg/dL — AB
HGB URINE DIPSTICK: NEGATIVE
Ketones, ur: NEGATIVE mg/dL
Leukocytes, UA: NEGATIVE
Nitrite: NEGATIVE
PH: 6 (ref 5.0–8.0)
Protein, ur: NEGATIVE mg/dL
SPECIFIC GRAVITY, URINE: 1.035 — AB (ref 1.005–1.030)
Urobilinogen, UA: 1 mg/dL (ref 0.0–1.0)

## 2015-04-25 LAB — COMPREHENSIVE METABOLIC PANEL
ALK PHOS: 288 U/L — AB (ref 38–126)
ALT: 56 U/L (ref 17–63)
AST: 78 U/L — ABNORMAL HIGH (ref 15–41)
Albumin: 3.2 g/dL — ABNORMAL LOW (ref 3.5–5.0)
Anion gap: 11 (ref 5–15)
BUN: 10 mg/dL (ref 6–20)
CALCIUM: 9 mg/dL (ref 8.9–10.3)
CO2: 29 mmol/L (ref 22–32)
CREATININE: 0.77 mg/dL (ref 0.61–1.24)
Chloride: 86 mmol/L — ABNORMAL LOW (ref 101–111)
GFR calc non Af Amer: >60 mL/min (ref >60)
Glucose, Bld: 392 mg/dL — ABNORMAL HIGH (ref 65–99)
Potassium: 4.5 mmol/L (ref 3.5–5.1)
Sodium: 126 mmol/L — ABNORMAL LOW (ref 135–145)
Total Bilirubin: 0.8 mg/dL (ref 0.3–1.2)
Total Protein: 8.1 g/dL (ref 6.5–8.1)

## 2015-04-25 LAB — LIPASE, BLOOD: Lipase: 10 U/L — ABNORMAL LOW (ref 22–51)

## 2015-04-25 LAB — I-STAT CG4 LACTIC ACID, ED: Lactic Acid, Venous: 2.21 mmol/L (ref 0.5–2.0)

## 2015-04-25 LAB — URINE MICROSCOPIC-ADD ON: Urine-Other: NONE SEEN

## 2015-04-25 MED ORDER — MORPHINE SULFATE (PF) 4 MG/ML IV SOLN
4.0000 mg | INTRAVENOUS | Status: DC | PRN
  Administered 2015-04-25: 4 mg via INTRAVENOUS
  Filled 2015-04-25: qty 1

## 2015-04-25 MED ORDER — ONDANSETRON HCL 4 MG/2ML IJ SOLN
4.0000 mg | Freq: Once | INTRAMUSCULAR | Status: AC
  Administered 2015-04-25: 4 mg via INTRAVENOUS
  Filled 2015-04-25: qty 2

## 2015-04-25 MED ORDER — SODIUM CHLORIDE 0.9 % IV BOLUS (SEPSIS)
1000.0000 mL | Freq: Once | INTRAVENOUS | Status: AC
  Administered 2015-04-25: 1000 mL via INTRAVENOUS

## 2015-04-25 MED ORDER — HYDROMORPHONE HCL 2 MG PO TABS: 2.0000 mg | ORAL_TABLET | ORAL | Status: DC | PRN

## 2015-04-25 MED ORDER — IOHEXOL 300 MG/ML  SOLN
100.0000 mL | Freq: Once | INTRAMUSCULAR | Status: AC | PRN
  Administered 2015-04-25: 100 mL via INTRAVENOUS

## 2015-04-25 MED ORDER — IOHEXOL 300 MG/ML  SOLN
50.0000 mL | Freq: Once | INTRAMUSCULAR | Status: DC | PRN
  Administered 2015-04-25: 50 mL via ORAL
  Filled 2015-04-25: qty 50

## 2015-04-25 NOTE — ED Notes (Signed)
Patient transported to CT 

## 2015-04-25 NOTE — Discharge Instructions (Signed)
Continue your plan follow-up with your oncology team. Dilaudid as needed for pain at home.  Abdominal Pain Many things can cause abdominal pain. Usually, abdominal pain is not caused by a disease and will improve without treatment. It can often be observed and treated at home. Your health care provider will do a physical exam and possibly order blood tests and X-rays to help determine the seriousness of your pain. However, in many cases, more time must pass before a clear cause of the pain can be found. Before that point, your health care provider may not know if you need more testing or further treatment. HOME CARE INSTRUCTIONS  Monitor your abdominal pain for any changes. The following actions may help to alleviate any discomfort you are experiencing:  Only take over-the-counter or prescription medicines as directed by your health care provider.  Do not take laxatives unless directed to do so by your health care provider.  Try a clear liquid diet (broth, tea, or water) as directed by your health care provider. Slowly move to a bland diet as tolerated. SEEK MEDICAL CARE IF:  You have unexplained abdominal pain.  You have abdominal pain associated with nausea or diarrhea.  You have pain when you urinate or have a bowel movement.  You experience abdominal pain that wakes you in the night.  You have abdominal pain that is worsened or improved by eating food.  You have abdominal pain that is worsened with eating fatty foods.  You have a fever. SEEK IMMEDIATE MEDICAL CARE IF:   Your pain does not go away within 2 hours.  You keep throwing up (vomiting).  Your pain is felt only in portions of the abdomen, such as the right side or the left lower portion of the abdomen.  You pass bloody or black tarry stools. MAKE SURE YOU:  Understand these instructions.   Will watch your condition.   Will get help right away if you are not doing well or get worse.  Document Released:  05/20/2005 Document Revised: 08/15/2013 Document Reviewed: 04/19/2013 [facility] Patient Information 2015 [place], [place]. This information is not intended to replace advice given to you by your health care provider. Make sure you discuss any questions you have with your health care provider.

## 2015-04-25 NOTE — ED Notes (Signed)
Pt reports taking ibuprofen 400mg  around 0800.

## 2015-04-25 NOTE — ED Notes (Signed)
Pt c/o fever, increasing generalized abdominal pain, and diarrhea x 2-3 days.  Pain score 6/10.  Hx of liver CA and recently completed 2 weeks of pallative radiation.

## 2015-04-25 NOTE — ED Notes (Signed)
Nurse drawing labs. 

## 2015-04-25 NOTE — ED Notes (Signed)
Ivar Drape, RN spoke with Dr. Jeneen Rinks regarding potential sepsis.   Rectal temp ordered and obtained.

## 2015-04-25 NOTE — ED Notes (Signed)
Dr. Jeneen Rinks made aware of lactic acid results.  No new orders given at this time.  Questioned need for potential sepsis, hold orders for now.

## 2015-04-25 NOTE — ED Notes (Signed)
Patient transported to X-ray 

## 2015-04-25 NOTE — ED Notes (Signed)
Dr Jeneen Rinks notified of patient lab result.
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
## 2015-04-25 NOTE — ED Notes (Signed)
Rectal temp within normal limits

## 2015-04-26 ENCOUNTER — Other Ambulatory Visit: Payer: Self-pay | Admitting: *Deleted

## 2015-04-26 ENCOUNTER — Telehealth: Payer: Self-pay | Admitting: *Deleted

## 2015-04-26 LAB — URINE CULTURE: CULTURE: NO GROWTH

## 2015-04-26 NOTE — Telephone Encounter (Signed)
Left VM for patient to call office next week to reschedule his missed appointment. Also asking for status report since it was noted he was in ED yesterday and if there is something we can do for him? Noted CT showed progression of liver disease.

## 2015-04-30 LAB — CULTURE, BLOOD (ROUTINE X 2)
CULTURE: NO GROWTH
Culture: NO GROWTH

## 2015-05-03 ENCOUNTER — Telehealth: Payer: Self-pay | Admitting: *Deleted

## 2015-05-03 NOTE — Telephone Encounter (Signed)
  Oncology Nurse Navigator Documentation    Navigator Encounter Type: Telephone (05/03/15 1257)  Left VM to follow up on status and his oncology care. Any issues or needs we can help with. Has he transferred his care back to [place]?

## 2015-05-08 ENCOUNTER — Encounter (HOSPITAL_COMMUNITY): Payer: Self-pay

## 2015-05-08 ENCOUNTER — Inpatient Hospital Stay (HOSPITAL_COMMUNITY)
Admission: EM | Admit: 2015-05-08 | Discharge: 2015-05-16 | DRG: 948 | Disposition: A | Discharge status: Hospice | Payer: Medicare Other | Attending: Family Medicine | Admitting: Family Medicine

## 2015-05-08 DIAGNOSIS — C220 Liver cell carcinoma: Secondary | ICD-10-CM | POA: Diagnosis not present

## 2015-05-08 DIAGNOSIS — K7460 Unspecified cirrhosis of liver: Secondary | ICD-10-CM | POA: Diagnosis present

## 2015-05-08 DIAGNOSIS — H5441 Blindness, right eye, normal vision left eye: Secondary | ICD-10-CM | POA: Diagnosis present

## 2015-05-08 DIAGNOSIS — R140 Abdominal distension (gaseous): Secondary | ICD-10-CM

## 2015-05-08 DIAGNOSIS — I10 Essential (primary) hypertension: Secondary | ICD-10-CM | POA: Diagnosis not present

## 2015-05-08 DIAGNOSIS — F4323 Adjustment disorder with mixed anxiety and depressed mood: Secondary | ICD-10-CM | POA: Insufficient documentation

## 2015-05-08 DIAGNOSIS — G893 Neoplasm related pain (acute) (chronic): Secondary | ICD-10-CM

## 2015-05-08 DIAGNOSIS — Z66 Do not resuscitate: Secondary | ICD-10-CM | POA: Diagnosis present

## 2015-05-08 DIAGNOSIS — R109 Unspecified abdominal pain: Secondary | ICD-10-CM | POA: Diagnosis present

## 2015-05-08 DIAGNOSIS — Z9221 Personal history of antineoplastic chemotherapy: Secondary | ICD-10-CM

## 2015-05-08 DIAGNOSIS — Z79899 Other long term (current) drug therapy: Secondary | ICD-10-CM

## 2015-05-08 DIAGNOSIS — E119 Type 2 diabetes mellitus without complications: Secondary | ICD-10-CM | POA: Diagnosis present

## 2015-05-08 DIAGNOSIS — K219 Gastro-esophageal reflux disease without esophagitis: Secondary | ICD-10-CM | POA: Diagnosis present

## 2015-05-08 DIAGNOSIS — E871 Hypo-osmolality and hyponatremia: Secondary | ICD-10-CM | POA: Diagnosis present

## 2015-05-08 DIAGNOSIS — Z791 Long term (current) use of non-steroidal anti-inflammatories (NSAID): Secondary | ICD-10-CM

## 2015-05-08 DIAGNOSIS — K5909 Other constipation: Secondary | ICD-10-CM | POA: Insufficient documentation

## 2015-05-08 DIAGNOSIS — K5900 Constipation, unspecified: Secondary | ICD-10-CM | POA: Diagnosis present

## 2015-05-08 DIAGNOSIS — C7989 Secondary malignant neoplasm of other specified sites: Secondary | ICD-10-CM | POA: Diagnosis present

## 2015-05-08 DIAGNOSIS — Z515 Encounter for palliative care: Secondary | ICD-10-CM

## 2015-05-08 DIAGNOSIS — C799 Secondary malignant neoplasm of unspecified site: Secondary | ICD-10-CM | POA: Insufficient documentation

## 2015-05-08 DIAGNOSIS — Z79891 Long term (current) use of opiate analgesic: Secondary | ICD-10-CM

## 2015-05-08 DIAGNOSIS — Z923 Personal history of irradiation: Secondary | ICD-10-CM

## 2015-05-08 DIAGNOSIS — Z87442 Personal history of urinary calculi: Secondary | ICD-10-CM

## 2015-05-08 DIAGNOSIS — Z91030 Bee allergy status: Secondary | ICD-10-CM

## 2015-05-08 DIAGNOSIS — Z87891 Personal history of nicotine dependence: Secondary | ICD-10-CM

## 2015-05-08 DIAGNOSIS — F419 Anxiety disorder, unspecified: Secondary | ICD-10-CM | POA: Diagnosis present

## 2015-05-08 DIAGNOSIS — R52 Pain, unspecified: Secondary | ICD-10-CM

## 2015-05-08 DIAGNOSIS — Z794 Long term (current) use of insulin: Secondary | ICD-10-CM

## 2015-05-08 DIAGNOSIS — E118 Type 2 diabetes mellitus with unspecified complications: Secondary | ICD-10-CM

## 2015-05-08 LAB — CBC WITH DIFFERENTIAL/PLATELET
Basophils Absolute: 0 10*3/uL (ref 0.0–0.1)
Basophils Relative: 0 %
EOS ABS: 0 10*3/uL (ref 0.0–0.7)
EOS PCT: 0 %
HCT: 36.1 % — ABNORMAL LOW (ref 39.0–52.0)
HEMOGLOBIN: 11.9 g/dL — AB (ref 13.0–17.0)
LYMPHS ABS: 0.8 10*3/uL (ref 0.7–4.0)
Lymphocytes Relative: 5 %
MCH: 27 pg (ref 26.0–34.0)
MCHC: 33 g/dL (ref 30.0–36.0)
MCV: 82 fL (ref 78.0–100.0)
MONOS PCT: 12 %
Monocytes Absolute: 2 10*3/uL — ABNORMAL HIGH (ref 0.1–1.0)
NEUTROS PCT: 82 %
Neutro Abs: 13.5 10*3/uL — ABNORMAL HIGH (ref 1.7–7.7)
Platelets: 485 10*3/uL — ABNORMAL HIGH (ref 150–400)
RBC: 4.4 MIL/uL (ref 4.22–5.81)
RDW: 17.8 % — ABNORMAL HIGH (ref 11.5–15.5)
WBC: 16.3 10*3/uL — ABNORMAL HIGH (ref 4.0–10.5)

## 2015-05-08 LAB — BASIC METABOLIC PANEL
ANION GAP: 7 (ref 5–15)
BUN: 7 mg/dL (ref 6–20)
CALCIUM: 7.2 mg/dL — AB (ref 8.9–10.3)
CO2: 29 mmol/L (ref 22–32)
CREATININE: 0.58 mg/dL — AB (ref 0.61–1.24)
Chloride: 93 mmol/L — ABNORMAL LOW (ref 101–111)
Glucose, Bld: 208 mg/dL — ABNORMAL HIGH (ref 65–99)
Potassium: 4 mmol/L (ref 3.5–5.1)
SODIUM: 129 mmol/L — AB (ref 135–145)

## 2015-05-08 MED ORDER — OXYCODONE HCL 5 MG PO TABS: 10.0000 mg | ORAL_TABLET | ORAL | Status: DC | PRN

## 2015-05-08 MED ORDER — SODIUM CHLORIDE 0.9 % IJ SOLN: 3.0000 mL | INTRAMUSCULAR | Status: DC | PRN

## 2015-05-08 MED ORDER — LISINOPRIL 20 MG PO TABS
40.0000 mg | ORAL_TABLET | Freq: Every morning | ORAL | Status: DC
  Administered 2015-05-09 – 2015-05-12 (×4): 40 mg via ORAL
  Filled 2015-05-08 (×5): qty 2

## 2015-05-08 MED ORDER — HYDROMORPHONE HCL 2 MG/ML IJ SOLN
4.0000 mg | INTRAMUSCULAR | Status: DC | PRN
  Administered 2015-05-09: 4 mg via INTRAVENOUS
  Filled 2015-05-08: qty 2

## 2015-05-08 MED ORDER — MORPHINE SULFATE ER 30 MG PO TBCR
90.0000 mg | EXTENDED_RELEASE_TABLET | Freq: Two times a day (BID) | ORAL | Status: DC
  Administered 2015-05-09: 90 mg via ORAL
  Filled 2015-05-08: qty 3

## 2015-05-08 MED ORDER — EPINEPHRINE 0.3 MG/0.3ML IJ SOAJ
0.3000 mg | INTRAMUSCULAR | Status: DC | PRN
  Filled 2015-05-08: qty 0.6

## 2015-05-08 MED ORDER — SODIUM CHLORIDE 0.9 % IV BOLUS (SEPSIS)
1000.0000 mL | Freq: Once | INTRAVENOUS | Status: AC
  Administered 2015-05-08: 1000 mL via INTRAVENOUS

## 2015-05-08 MED ORDER — TRAZODONE HCL 50 MG PO TABS: 50.0000 mg | ORAL_TABLET | Freq: Every evening | ORAL | Status: DC | PRN

## 2015-05-08 MED ORDER — MORPHINE SULFATE ER 30 MG PO TBCR: 60.0000 mg | EXTENDED_RELEASE_TABLET | Freq: Two times a day (BID) | ORAL | Status: DC

## 2015-05-08 MED ORDER — SODIUM CHLORIDE 0.9 % IJ SOLN
3.0000 mL | Freq: Two times a day (BID) | INTRAMUSCULAR | Status: DC
  Administered 2015-05-09 – 2015-05-15 (×10): 3 mL via INTRAVENOUS

## 2015-05-08 MED ORDER — SODIUM CHLORIDE 0.9 % IV SOLN: 250.0000 mL | INTRAVENOUS | Status: DC | PRN

## 2015-05-08 MED ORDER — MORPHINE SULFATE ER 30 MG PO TBCR
60.0000 mg | EXTENDED_RELEASE_TABLET | Freq: Two times a day (BID) | ORAL | Status: DC
  Administered 2015-05-08: 60 mg via ORAL
  Filled 2015-05-08: qty 4

## 2015-05-08 MED ORDER — PROPRANOLOL HCL 10 MG PO TABS
20.0000 mg | ORAL_TABLET | Freq: Every day | ORAL | Status: DC
  Administered 2015-05-09 – 2015-05-12 (×4): 20 mg via ORAL
  Filled 2015-05-08 (×6): qty 2

## 2015-05-08 MED ORDER — SORBITOL 70 % PO SOLN
30.0000 mL | Freq: Every day | ORAL | Status: DC
  Filled 2015-05-08 (×7): qty 30

## 2015-05-08 MED ORDER — LACTULOSE 10 GM/15ML PO SOLN: 10.0000 g | Freq: Two times a day (BID) | ORAL | Status: DC

## 2015-05-08 MED ORDER — IBUPROFEN 200 MG PO TABS: 400.0000 mg | ORAL_TABLET | ORAL | Status: DC | PRN

## 2015-05-08 MED ORDER — PRUTECT EX EMUL
1.0000 "application " | Freq: Every day | CUTANEOUS | Status: DC
  Administered 2015-05-09 – 2015-05-10 (×2): 1 via TOPICAL
  Filled 2015-05-08 (×2): qty 45

## 2015-05-08 MED ORDER — INSULIN GLARGINE 100 UNIT/ML ~~LOC~~ SOLN
32.0000 [IU] | Freq: Every day | SUBCUTANEOUS | Status: DC | PRN
  Administered 2015-05-12 (×2): 32 [IU] via SUBCUTANEOUS
  Filled 2015-05-08 (×5): qty 0.32

## 2015-05-08 MED ORDER — FERROUS FUMARATE 325 (106 FE) MG PO TABS
1.0000 | ORAL_TABLET | Freq: Two times a day (BID) | ORAL | Status: DC
  Administered 2015-05-09 (×3): 106 mg via ORAL
  Administered 2015-05-10: 1 via ORAL
  Administered 2015-05-10 – 2015-05-13 (×6): 106 mg via ORAL
  Administered 2015-05-14: 1 via ORAL
  Filled 2015-05-08 (×21): qty 1

## 2015-05-08 MED ORDER — HYDROMORPHONE HCL 1 MG/ML IJ SOLN
1.0000 mg | Freq: Once | INTRAMUSCULAR | Status: AC
  Administered 2015-05-08: 1 mg via INTRAVENOUS
  Filled 2015-05-08: qty 1

## 2015-05-08 MED ORDER — MORPHINE SULFATE ER 30 MG PO TBCR: 30.0000 mg | EXTENDED_RELEASE_TABLET | Freq: Two times a day (BID) | ORAL | Status: DC

## 2015-05-08 MED ORDER — SORBITOL 70 % SOLN
30.0000 mL | Freq: Every day | Status: DC
  Administered 2015-05-09 – 2015-05-10 (×2): 30 mL via ORAL
  Filled 2015-05-08 (×2): qty 30

## 2015-05-08 MED ORDER — AMLODIPINE BESYLATE 10 MG PO TABS
10.0000 mg | ORAL_TABLET | Freq: Every day | ORAL | Status: DC
  Administered 2015-05-09 – 2015-05-12 (×4): 10 mg via ORAL
  Filled 2015-05-08 (×5): qty 1

## 2015-05-08 MED ORDER — HYDROMORPHONE HCL 2 MG/ML IJ SOLN
3.0000 mg | Freq: Once | INTRAMUSCULAR | Status: AC
  Administered 2015-05-08: 3 mg via INTRAVENOUS
  Filled 2015-05-08: qty 2
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
## 2015-05-08 NOTE — ED Notes (Signed)
He c/o generalized abd. Pain which is "becomming unbearable".  He further tells me he has stage 4 liver cancer and additional cancer has been found even after recent radiation treatments.  He sits in his position of comfort which is very upright.  His abd. Is firm and distended.  He states he has a b.m. Typically every other day.
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
## 2015-05-09 ENCOUNTER — Encounter (HOSPITAL_COMMUNITY): Payer: Self-pay | Admitting: *Deleted

## 2015-05-09 DIAGNOSIS — E871 Hypo-osmolality and hyponatremia: Secondary | ICD-10-CM

## 2015-05-09 DIAGNOSIS — F419 Anxiety disorder, unspecified: Secondary | ICD-10-CM | POA: Diagnosis present

## 2015-05-09 DIAGNOSIS — Z91030 Bee allergy status: Secondary | ICD-10-CM | POA: Diagnosis not present

## 2015-05-09 DIAGNOSIS — R140 Abdominal distension (gaseous): Secondary | ICD-10-CM | POA: Diagnosis not present

## 2015-05-09 DIAGNOSIS — Z9221 Personal history of antineoplastic chemotherapy: Secondary | ICD-10-CM | POA: Diagnosis not present

## 2015-05-09 DIAGNOSIS — R52 Pain, unspecified: Secondary | ICD-10-CM | POA: Diagnosis not present

## 2015-05-09 DIAGNOSIS — C801 Malignant (primary) neoplasm, unspecified: Secondary | ICD-10-CM | POA: Diagnosis not present

## 2015-05-09 DIAGNOSIS — Z87442 Personal history of urinary calculi: Secondary | ICD-10-CM | POA: Diagnosis not present

## 2015-05-09 DIAGNOSIS — Z66 Do not resuscitate: Secondary | ICD-10-CM | POA: Diagnosis present

## 2015-05-09 DIAGNOSIS — F4323 Adjustment disorder with mixed anxiety and depressed mood: Secondary | ICD-10-CM | POA: Diagnosis not present

## 2015-05-09 DIAGNOSIS — E119 Type 2 diabetes mellitus without complications: Secondary | ICD-10-CM | POA: Diagnosis present

## 2015-05-09 DIAGNOSIS — K7460 Unspecified cirrhosis of liver: Secondary | ICD-10-CM | POA: Diagnosis present

## 2015-05-09 DIAGNOSIS — Z79891 Long term (current) use of opiate analgesic: Secondary | ICD-10-CM | POA: Diagnosis not present

## 2015-05-09 DIAGNOSIS — C772 Secondary and unspecified malignant neoplasm of intra-abdominal lymph nodes: Secondary | ICD-10-CM | POA: Diagnosis not present

## 2015-05-09 DIAGNOSIS — C7989 Secondary malignant neoplasm of other specified sites: Secondary | ICD-10-CM | POA: Diagnosis present

## 2015-05-09 DIAGNOSIS — Z79899 Other long term (current) drug therapy: Secondary | ICD-10-CM | POA: Diagnosis not present

## 2015-05-09 DIAGNOSIS — I10 Essential (primary) hypertension: Secondary | ICD-10-CM | POA: Diagnosis not present

## 2015-05-09 DIAGNOSIS — R109 Unspecified abdominal pain: Secondary | ICD-10-CM | POA: Diagnosis present

## 2015-05-09 DIAGNOSIS — Z923 Personal history of irradiation: Secondary | ICD-10-CM | POA: Diagnosis not present

## 2015-05-09 DIAGNOSIS — K219 Gastro-esophageal reflux disease without esophagitis: Secondary | ICD-10-CM | POA: Diagnosis present

## 2015-05-09 DIAGNOSIS — K5900 Constipation, unspecified: Secondary | ICD-10-CM | POA: Diagnosis present

## 2015-05-09 DIAGNOSIS — Z515 Encounter for palliative care: Secondary | ICD-10-CM | POA: Diagnosis not present

## 2015-05-09 DIAGNOSIS — H5441 Blindness, right eye, normal vision left eye: Secondary | ICD-10-CM | POA: Diagnosis present

## 2015-05-09 DIAGNOSIS — K5909 Other constipation: Secondary | ICD-10-CM | POA: Insufficient documentation

## 2015-05-09 DIAGNOSIS — Z794 Long term (current) use of insulin: Secondary | ICD-10-CM | POA: Diagnosis not present

## 2015-05-09 DIAGNOSIS — G893 Neoplasm related pain (acute) (chronic): Secondary | ICD-10-CM | POA: Diagnosis present

## 2015-05-09 DIAGNOSIS — C220 Liver cell carcinoma: Secondary | ICD-10-CM | POA: Diagnosis present

## 2015-05-09 DIAGNOSIS — Z87891 Personal history of nicotine dependence: Secondary | ICD-10-CM | POA: Diagnosis not present

## 2015-05-09 DIAGNOSIS — Z791 Long term (current) use of non-steroidal anti-inflammatories (NSAID): Secondary | ICD-10-CM | POA: Diagnosis not present

## 2015-05-09 DIAGNOSIS — C770 Secondary and unspecified malignant neoplasm of lymph nodes of head, face and neck: Secondary | ICD-10-CM | POA: Diagnosis not present

## 2015-05-09 LAB — COMPREHENSIVE METABOLIC PANEL
ALK PHOS: 386 U/L — AB (ref 38–126)
ALT: 49 U/L (ref 17–63)
ANION GAP: 10 (ref 5–15)
AST: 161 U/L — ABNORMAL HIGH (ref 15–41)
Albumin: 3.1 g/dL — ABNORMAL LOW (ref 3.5–5.0)
BUN: 8 mg/dL (ref 6–20)
CALCIUM: 8.7 mg/dL — AB (ref 8.9–10.3)
CO2: 32 mmol/L (ref 22–32)
Chloride: 84 mmol/L — ABNORMAL LOW (ref 101–111)
Creatinine, Ser: 0.87 mg/dL (ref 0.61–1.24)
Glucose, Bld: 262 mg/dL — ABNORMAL HIGH (ref 65–99)
SODIUM: 126 mmol/L — AB (ref 135–145)
TOTAL PROTEIN: 8.1 g/dL (ref 6.5–8.1)
Total Bilirubin: 2.4 mg/dL — ABNORMAL HIGH (ref 0.3–1.2)

## 2015-05-09 LAB — GLUCOSE, CAPILLARY
GLUCOSE-CAPILLARY: 268 mg/dL — AB (ref 65–99)
Glucose-Capillary: 287 mg/dL — ABNORMAL HIGH (ref 65–99)

## 2015-05-09 MED ORDER — FLEET ENEMA 7-19 GM/118ML RE ENEM: 1.0000 | ENEMA | Freq: Every day | RECTAL | Status: DC | PRN

## 2015-05-09 MED ORDER — BACLOFEN 10 MG PO TABS: 10.0000 mg | ORAL_TABLET | Freq: Three times a day (TID) | ORAL | Status: DC | PRN

## 2015-05-09 MED ORDER — MORPHINE SULFATE ER 100 MG PO TBCR
100.0000 mg | EXTENDED_RELEASE_TABLET | Freq: Two times a day (BID) | ORAL | Status: AC
  Administered 2015-05-09: 100 mg via ORAL
  Filled 2015-05-09: qty 1

## 2015-05-09 MED ORDER — BISACODYL 10 MG RE SUPP: 10.0000 mg | Freq: Every day | RECTAL | Status: DC | PRN

## 2015-05-09 MED ORDER — ONDANSETRON HCL 4 MG/2ML IJ SOLN
4.0000 mg | Freq: Four times a day (QID) | INTRAMUSCULAR | Status: DC | PRN
  Administered 2015-05-09 – 2015-05-11 (×2): 4 mg via INTRAVENOUS
  Filled 2015-05-09 (×2): qty 2

## 2015-05-09 MED ORDER — ENSURE ENLIVE PO LIQD
237.0000 mL | Freq: Three times a day (TID) | ORAL | Status: DC
  Administered 2015-05-09 – 2015-05-13 (×7): 237 mL via ORAL

## 2015-05-09 MED ORDER — MAGNESIUM HYDROXIDE 400 MG/5ML PO SUSP
30.0000 mL | Freq: Two times a day (BID) | ORAL | Status: DC
  Filled 2015-05-09 (×4): qty 30

## 2015-05-09 MED ORDER — LACTULOSE 10 GM/15ML PO SOLN
20.0000 g | Freq: Three times a day (TID) | ORAL | Status: DC
  Administered 2015-05-09: 20 g via ORAL
  Filled 2015-05-09 (×3): qty 30

## 2015-05-09 MED ORDER — SODIUM CHLORIDE 0.9 % IV SOLN
INTRAVENOUS | Status: DC
  Administered 2015-05-09: 10:00:00 via INTRAVENOUS

## 2015-05-09 MED ORDER — SENNA 8.6 MG PO TABS: 2.0000 | ORAL_TABLET | Freq: Every day | ORAL | Status: DC

## 2015-05-09 MED ORDER — PANTOPRAZOLE SODIUM 40 MG PO TBEC
40.0000 mg | DELAYED_RELEASE_TABLET | Freq: Two times a day (BID) | ORAL | Status: DC
  Administered 2015-05-09 – 2015-05-13 (×9): 40 mg via ORAL
  Filled 2015-05-09 (×10): qty 1

## 2015-05-09 MED ORDER — POLYETHYLENE GLYCOL 3350 17 G PO PACK: 17.0000 g | PACK | Freq: Two times a day (BID) | ORAL | Status: DC

## 2015-05-09 MED ORDER — OXYCODONE HCL 5 MG PO TABS: 30.0000 mg | ORAL_TABLET | ORAL | Status: DC | PRN

## 2015-05-09 MED ORDER — INSULIN ASPART 100 UNIT/ML ~~LOC~~ SOLN
0.0000 [IU] | Freq: Three times a day (TID) | SUBCUTANEOUS | Status: DC
  Administered 2015-05-09: 5 [IU] via SUBCUTANEOUS
  Administered 2015-05-10: 2 [IU] via SUBCUTANEOUS
  Administered 2015-05-10: 5 [IU] via SUBCUTANEOUS
  Administered 2015-05-10: 3 [IU] via SUBCUTANEOUS
  Administered 2015-05-11: 2 [IU] via SUBCUTANEOUS
  Administered 2015-05-11: 3 [IU] via SUBCUTANEOUS
  Administered 2015-05-11: 5 [IU] via SUBCUTANEOUS
  Administered 2015-05-12: 3 [IU] via SUBCUTANEOUS
  Administered 2015-05-12 – 2015-05-13 (×3): 2 [IU] via SUBCUTANEOUS
  Administered 2015-05-13: 1 [IU] via SUBCUTANEOUS
  Administered 2015-05-13 – 2015-05-14 (×3): 2 [IU] via SUBCUTANEOUS

## 2015-05-09 MED ORDER — DEXAMETHASONE SODIUM PHOSPHATE 4 MG/ML IJ SOLN
2.0000 mg | INTRAMUSCULAR | Status: DC
  Administered 2015-05-09 – 2015-05-10 (×2): 2 mg via INTRAVENOUS
  Filled 2015-05-09 (×3): qty 1

## 2015-05-09 MED ORDER — PREGABALIN 50 MG PO CAPS
50.0000 mg | ORAL_CAPSULE | Freq: Two times a day (BID) | ORAL | Status: DC
  Administered 2015-05-09 – 2015-05-10 (×3): 50 mg via ORAL
  Filled 2015-05-09 (×3): qty 1

## 2015-05-09 MED ORDER — BACLOFEN 10 MG PO TABS
10.0000 mg | ORAL_TABLET | Freq: Two times a day (BID) | ORAL | Status: DC
  Administered 2015-05-09 – 2015-05-10 (×3): 10 mg via ORAL
  Filled 2015-05-09 (×3): qty 1

## 2015-05-09 MED ORDER — FENTANYL 75 MCG/HR TD PT72
75.0000 ug | MEDICATED_PATCH | TRANSDERMAL | Status: DC
  Administered 2015-05-09: 75 ug via TRANSDERMAL
  Filled 2015-05-09: qty 1

## 2015-05-09 MED ORDER — FENTANYL CITRATE (PF) 100 MCG/2ML IJ SOLN
100.0000 ug | INTRAMUSCULAR | Status: DC | PRN
  Administered 2015-05-09 – 2015-05-10 (×8): 100 ug via INTRAVENOUS
  Filled 2015-05-09 (×10): qty 2

## 2015-05-09 MED ORDER — BACLOFEN 10 MG PO TABS
5.0000 mg | ORAL_TABLET | Freq: Two times a day (BID) | ORAL | Status: DC
Start: 2015-05-09 — End: 2015-05-09
  Administered 2015-05-09: 5 mg via ORAL
  Filled 2015-05-09: qty 1

## 2015-05-09 MED ORDER — MORPHINE SULFATE ER 100 MG PO TBCR: 100.0000 mg | EXTENDED_RELEASE_TABLET | Freq: Two times a day (BID) | ORAL | Status: DC

## 2015-05-09 MED ORDER — BACLOFEN 10 MG PO TABS: 10.0000 mg | ORAL_TABLET | Freq: Two times a day (BID) | ORAL | Status: DC

## 2015-05-09 NOTE — Progress Notes (Signed)
Inpatient Diabetes Program Recommendations  AACE/ADA: New Consensus Statement on Inpatient Glycemic Control (2015)  Target Ranges:  Prepandial:   less than 140 mg/dL      Peak postprandial:   less than 180 mg/dL (1-2 hours)      Critically ill patients:  140 - 180 mg/dL   Review of Glycemic Control  Diabetes history: DM2 Outpatient Diabetes medications: Lantus 32 units QD if blood sugars > 200-220 Current orders for Inpatient glycemic control: Same as above  59 yo male with h/o stage 4 metastatic liver cancer undergoing no further treatment comes in with 3 days of worsening abdominal pain at home.  Hx DM2.  Results for GREGOR, DERSHEM (MRN [id number]) as of 05/09/2015 11:13  Ref. Range 04/25/2015 13:21 05/08/2015 16:38 05/08/2015 18:15  Glucose Latest Ref Range: 65-99 mg/dL 392 (H) 262 (H) 208 (H)    Inpatient Diabetes Program Recommendations: Insulin - Basal: Decrease Lantus to 15 units QHS Correction (SSI): Add Novolog sensitive tidwc  Will follow. Thank you. Lorenda Peck, RD, LDN, CDE Inpatient Diabetes Coordinator [phone]
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
## 2015-05-09 NOTE — Progress Notes (Addendum)
PATIENT DETAILS Name: Stuart Moore Age: 59 y.o. Sex: male Date of Birth: 08-18-1956 Admit Date: 05/08/2015 Admitting Physician Phillips Grout, MD [id number]  Subjective: Pain better controlled.  Assessment/Plan: Principal Problem: Intractable abdominal pain: Secondary to progressive stage IV hepatocellular carcinoma. Appreciate palliative care input, now on transdermal fentanyl. Continue bowel regimen. Monitor for 1 more day to make sure pain. Her regimen is appropriate prior to discharge. Patient interested in home hospice services, weill ask case management to offer home hospice choices  Active Problems:  Essential hypertension: Controlled-continue with lisinopril, propranolol and amlodipine  Hiccups-persistent: Start baclofen. Follow.  Metastatic Hepatocellular carcinoma: Unfortunately has progressive disease despite radiation and chemotherapy. Patient interested in home hospice services. Unfortunately past few days, patient with intractable abdominal pain and very poor oral intake.  Hyponatremia: Chronic issue. Minimal-continue to monitor.  Type 2 diabetes: CBGs stable, continue 32 units of Lantus and SSI. Follow.  GERD: Continue PPI  Constipation: Continue sorbitol, MOM and lactulose. Suspect related to narcotics.  Disposition: Remain inpatient-home with hospice on 9/16  Antimicrobial agents  See below  Anti-infectives    None      DVT Prophylaxis: SCD's  Code Status: DNR  Family Communication None at bedside  Procedures: None  CONSULTS:  Palliative care medicine  Time spent 30 minutes-Greater than 50% of this time was spent in counseling, explanation of diagnosis, planning of further management, and coordination of care.  MEDICATIONS: Scheduled Meds: . amLODipine  10 mg Oral Daily  . baclofen  5 mg Oral BID  . dexamethasone  2 mg Intravenous Q24H  . feeding supplement (ENSURE ENLIVE)  237 mL Oral TID BM    . fentaNYL  75 mcg Transdermal Q72H  . ferrous fumarate  1 tablet Oral BID  . lactulose  20 g Oral TID  . lisinopril  40 mg Oral q morning - 10a  . magnesium hydroxide  30 mL Oral BID  . pantoprazole  40 mg Oral BID  . pregabalin  50 mg Oral BID  . propranolol  20 mg Oral Daily  . PRUTECT  1 application Topical Daily  . sodium chloride  3 mL Intravenous Q12H  . sorbitol  30 mL Oral Daily   Continuous Infusions:  PRN Meds:.sodium chloride, bisacodyl, EPINEPHrine, fentaNYL (SUBLIMAZE) injection, insulin glargine, ondansetron, sodium chloride, sodium phosphate    PHYSICAL EXAM: Vital signs in last 24 hours: Filed Vitals:   05/08/15 1948 05/08/15 2253 05/08/15 2332 05/09/15 0517  BP: 118/79 114/75 118/78 119/72  Pulse: 101 108 98 103  Temp:  97.9 F (36.6 C) 99.6 F (37.6 C) 98.4 F (36.9 C)  TempSrc:  Oral Oral Oral  Resp: 13 16 16 16   Height:   5\' 7"  (1.702 m)   Weight:   72.576 kg (160 lb)   SpO2: 97% 100% 100% 95%    Weight change:  Filed Weights   05/08/15 2332  Weight: 72.576 kg (160 lb)   Body mass index is 25.05 kg/(m^2).   Gen Exam: Awake and alert with clear speech.  Neck: Supple, No JVD.   Chest: B/L Clear.   CVS: S1 S2 Regular, no murmurs.  Abdomen: soft, BS +, non tender, mildly distended.  Extremities: no edema, lower extremities warm to touch. Neurologic: Non Focal.   Skin: No Rash.   Wounds: N/A.    Intake/Output from previous day:  Intake/Output Summary (Last 24 hours) at 05/09/15  [place] filed at 05/09/15 0520  Gross per 24 hour  Intake      0 ml  Output    300 ml  Net   -300 ml     LAB RESULTS: CBC  Recent Labs Lab 05/08/15 1638  WBC 16.3*  HGB 11.9*  HCT 36.1*  PLT 485*  MCV 82.0  MCH 27.0  MCHC 33.0  RDW 17.8*  LYMPHSABS 0.8  MONOABS 2.0*  EOSABS 0.0  BASOSABS 0.0    Chemistries   Recent Labs Lab 05/08/15 1638 05/08/15 1815  NA 126* 129*  K SPECIMEN HEMOLYZED. HEMOLYSIS MAY AFFECT INTEGRITY OF RESULTS.  4.0  CL 84* 93*  CO2 32 29  GLUCOSE 262* 208*  BUN 8 7  CREATININE 0.87 0.58*  CALCIUM 8.7* 7.2*    CBG: No results for input(s): GLUCAP in the last 168 hours.  GFR Estimated Creatinine Clearance: 91.8 mL/min (by C-G formula based on Cr of 0.58).  Coagulation profile No results for input(s): INR, PROTIME in the last 168 hours.  Cardiac Enzymes No results for input(s): CKMB, TROPONINI, MYOGLOBIN in the last 168 hours.  Invalid input(s): CK  Invalid input(s): POCBNP No results for input(s): DDIMER in the last 72 hours. No results for input(s): HGBA1C in the last 72 hours. No results for input(s): CHOL, HDL, LDLCALC, TRIG, CHOLHDL, LDLDIRECT in the last 72 hours. No results for input(s): TSH, T4TOTAL, T3FREE, THYROIDAB in the last 72 hours.  Invalid input(s): FREET3 No results for input(s): VITAMINB12, FOLATE, FERRITIN, TIBC, IRON, RETICCTPCT in the last 72 hours. No results for input(s): LIPASE, AMYLASE in the last 72 hours.  Urine Studies No results for input(s): UHGB, CRYS in the last 72 hours.  Invalid input(s): UACOL, UAPR, USPG, UPH, UTP, UGL, UKET, UBIL, UNIT, UROB, ULEU, UEPI, UWBC, URBC, UBAC, CAST, UCOM, BILUA  MICROBIOLOGY: No results found for this or any previous visit (from the past 240 hour(s)).  RADIOLOGY STUDIES/RESULTS: Dg Chest 2 View  04/25/2015   CLINICAL DATA:  Fever. Progressive abdominal pain and diarrhea for 2 days. History of liver cancer, status post radiation therapy.  EXAM: CHEST  2 VIEW  COMPARISON:  02/12/2015  FINDINGS: Numerous leads and wires project over the chest. Mild right hemidiaphragm elevation. Midline trachea. Remote left rib trauma. Mild cardiomegaly. No pleural effusion or pneumothorax. Bibasilar volume loss with atelectasis. No lobar consolidation. Small bowel air-fluid levels are nonspecific.  IMPRESSION: Bibasilar volume loss with subsegmental atelectasis. No acute cardiopulmonary disease.  Nonspecific small bowel fluid levels.    Electronically Signed   By: Abigail Miyamoto M.D.   On: 04/25/2015 13:52   Ct Abdomen Pelvis W Contrast  04/25/2015   CLINICAL DATA:  59 year old with recent completion of palliative radiation therapy for metastatic hepatocellular carcinoma, presenting with fever, steadily worsening generalized abdominal pain and diarrhea over the past 3 days.  EXAM: CT ABDOMEN AND PELVIS WITH CONTRAST  TECHNIQUE: Multidetector CT imaging of the abdomen and pelvis was performed using the standard protocol following bolus administration of intravenous contrast.  CONTRAST:  111mL OMNIPAQUE IOHEXOL 300 MG/ML IV. Oral contrast was also administered.  COMPARISON:  03/05/2015 and earlier.  FINDINGS: Hepatobiliary: Hepatic cirrhosis with relative enlargement of the left lobe and caudate lobe as noted previously. Interval enlargement of previously identified multiple liver masses, as well as multiple new masses when compared to the most recent previous CT 03/05/2015, the largest mass in the lateral segment left lobe measuring approximately 2.6 x 3.4 cm (less than 1 cm on  the prior CT). Patent portal vein. Mild wall thickening and enhancement of the mucosa of the gallbladder. No calcified gallstones. No pericholecystic inflammation.  Spleen:  Normal in size and appearance.  Pancreas:  Normal in appearance.  No pancreatic ductal dilation.  Adrenal glands: Normal left adrenal gland. Approximate 1.6 x 2.7 cm mass involving the right adrenal gland, increased in size since the prior CT.  Genitourinary: Nonobstructing approximate 2-3 mm calculus in an upper pole calix of the right kidney. No urinary tract calculi elsewhere on either side. No evidence of urinary tract obstruction. 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 Normal-appearing urinary bladder.  Prostate gland and seminal vesicles normal in size and appearance for age.  Gastrointestinal: Varices involving the distal esophagus. No intrinsic abnormality involving  the stomach. Mild wall thickening involving the entire duodenum. Small bowel otherwise normal in appearance. Normal-appearing colon with moderate stool burden.  Ascites: Small amount of ascites dependently in the pelvis and in the perihepatic space.  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  Lymphatic: Bulky lymphadenopathy in the gastrohepatic ligament, hepatoduodenal ligament, porta hepatis, celiac axis and retroperitoneum of the upper abdomen with evidence of necrosis, likely related to the recent radiation therapy. Enlarged lymph nodes at the root of the mesentery have increased in size, and there is enlargement of left periaortic retroperitoneal lymph nodes. The lymphadenopathy causes moderate compression of the infrahepatic IVC and causes mass-effect upon the gastric antrum, duodenal bulb and descending duodenum.  Other findings: Small bilateral inguinal hernias containing fat. Increased edema and/or inflammation in the mesentery and the retroperitoneum since the prior examination.  Musculoskeletal: Intramedullary nail in the right femur. No evidence of osseous metastatic disease.  Visualized lower thorax: Heart mildly enlarged but stable. Atelectasis/scarring in the right middle lobe and both lower lobes associated with bronchiectasis, unchanged.  IMPRESSION: 1. Bulky lymphadenopathy in the abdomen as detailed above. Since the most recent prior CT 03/05/2015, many of the nodes have become necrotic, likely as a result of radiation therapy. 2. Interval enlargement of previously identified liver masses and multiple new liver masses since the most recent prior CT. 3. Edema/inflammation involving the mesentery and the retroperitoneum, a new finding. This may be the result of radiation therapy or may reflect lymphatic obstruction related to the bulky lymphadenopathy. 4. Enlarging right adrenal metastasis. 5. Small amount of ascites in the pelvis and in the perihepatic space. 6. Stable  mild gallbladder wall thickening which is likely related to the chronic hepatocellular disease. No calcified gallstones and no evidence of acute cholecystitis. 7. Stable atelectasis/scarring in the right middle lobe and both lower lobes with bronchiectasis.   Electronically Signed   By: Evangeline Dakin M.D.   On: 04/25/2015 15:53    Oren Binet, MD  [facility] Pager:[phone]  If 7PM-7AM, please contact night-coverage [email or web address] Password [facility] 05/09/2015, 12:24 PM

## 2015-05-09 NOTE — Progress Notes (Signed)
   05/09/15 1200  Clinical Encounter Type  Visited With Patient and family together  Visit Type Initial;Psychological support;Spiritual support  Referral From Nurse  Consult/Referral To Chaplain  Spiritual Encounters  Spiritual Needs Emotional;Other (Comment) (Pastoral Conversation)  Stress Factors  Patient Stress Factors Health changes;Major life changes  Family Stress Factors Health changes;Major life changes   Chaplain visited with patient after ref. from the nurse. The patient was laying on his side in the bed and showing signs of being in pain. His wife was sitting on the window seat near him. Both the patient and his wife were receptive to a visit from the [place].  The patient informed the Chaplain that he would be having a conversation with a doctor this afternoon about Hospice in place and pain management. He understood his condition, the severity of it and is at peace with the fact that his condition is worsening.  His wife stated that she was doing "ok." They both told the Chaplain that their faith in God is what is getting them through, even though it is difficult.  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  They were receptive to a follow up visit from the [place]. Chaplain interventions included pastoral conversation, psychological /emotional support, family support and spiritual assessment.  Chaplain will follow up with the patient and his wife.

## 2015-05-09 NOTE — Progress Notes (Signed)
Met again today with patient and his wife. We discussed his current pain regimen and next steps for titration of his current meds. They are realistic about prognosis and appropriately grieving. They are agreeable for hospice care and have chosen [facility] will place CM referral. Noted new muscle twitching in his left bicep. Will check repeat labs fu potassium and eval for hypercalcemia- may also be morphine metabolites causing this-fentanyl should improve this. Will follow up tomorrow- if pain improved he could go home with hospice services.  Lane Hacker, DO Palliative Medicine

## 2015-05-09 NOTE — Care Management Note (Signed)
Case Management Note  Patient Details  Name: Stuart Moore MRN: [id number] Date of Birth: [birth year]  Subjective/Objective:                59 yo admitted with continuous severe abdominal pain. Hx of progressive stage IV hepatocellular carcinoma    Action/Plan: From home with spouse and daughter  Expected Discharge Date:                  Expected Discharge Plan:  Home w Hospice Care  In-House Referral:     Discharge planning Services  CM Consult  Post Acute Care Choice:  Hospice Choice offered to:  Patient  DME Arranged:    DME Agency:     HH Arranged:  Disease Management [place] Agency:     Status of Service:  In process, will continue to follow  Medicare Important Message Given:    Date Medicare IM Given:    Medicare IM give by:    Date Additional Medicare IM Given:    Additional Medicare Important Message give by:     If discussed at [place] of Stay Meetings, dates discussed:    Additional Comments: CM consult for home hospice choice. This CM met with pt and pt's brother at bedside to offer home hospice choice. Pt given list of home hospice providers and wants to talk to his wife and daughter prior to deciding. CM will check back for hospice choice.  Lynnell Catalan, RN 05/09/2015, 1:53 PM

## 2015-05-10 DIAGNOSIS — Z515 Encounter for palliative care: Secondary | ICD-10-CM | POA: Insufficient documentation

## 2015-05-10 LAB — GLUCOSE, CAPILLARY
GLUCOSE-CAPILLARY: 178 mg/dL — AB (ref 65–99)
GLUCOSE-CAPILLARY: 230 mg/dL — AB (ref 65–99)
Glucose-Capillary: 280 mg/dL — ABNORMAL HIGH (ref 65–99)

## 2015-05-10 MED ORDER — FENTANYL BOLUS VIA INFUSION
125.0000 ug | INTRAVENOUS | Status: DC | PRN
  Administered 2015-05-10: 125 ug via INTRAVENOUS
  Administered 2015-05-11 (×2): 100 ug via INTRAVENOUS
  Filled 2015-05-10: qty 125

## 2015-05-10 MED ORDER — SODIUM CHLORIDE 0.9 % IV SOLN
400.0000 ug/h | INTRAVENOUS | Status: DC
  Administered 2015-05-10: 75 ug/h via INTRAVENOUS
  Administered 2015-05-10: 125 ug/h via INTRAVENOUS
  Administered 2015-05-11: 150 ug/h via INTRAVENOUS
  Administered 2015-05-11: 125 ug/h via INTRAVENOUS
  Administered 2015-05-12: 175 ug/h via INTRAVENOUS
  Administered 2015-05-12: 150 ug/h via INTRAVENOUS
  Administered 2015-05-13 (×2): 175 ug/h via INTRAVENOUS
  Administered 2015-05-14 (×2): 250 ug/h via INTRAVENOUS
  Administered 2015-05-14 – 2015-05-15 (×4): 300 ug/h via INTRAVENOUS
  Administered 2015-05-16 (×2): 400 ug/h via INTRAVENOUS
  Filled 2015-05-10 (×14): qty 50

## 2015-05-10 MED ORDER — BACLOFEN 10 MG PO TABS: 10.0000 mg | ORAL_TABLET | Freq: Two times a day (BID) | ORAL | Status: AC

## 2015-05-10 MED ORDER — FENTANYL CITRATE (PF) 100 MCG/2ML IJ SOLN: 100.0000 ug | INTRAMUSCULAR | Status: DC | PRN

## 2015-05-10 MED ORDER — ENSURE ENLIVE PO LIQD: 237.0000 mL | Freq: Three times a day (TID) | ORAL | Status: AC

## 2015-05-10 MED ORDER — FENTANYL 75 MCG/HR TD PT72: 75.0000 ug | MEDICATED_PATCH | TRANSDERMAL | Status: DC

## 2015-05-10 MED ORDER — OXYCODONE HCL 10 MG PO TABS: 20.0000 mg | ORAL_TABLET | ORAL | Status: DC | PRN

## 2015-05-10 MED ORDER — POLYETHYLENE GLYCOL 3350 17 G PO PACK: 17.0000 g | PACK | Freq: Every day | ORAL | Status: DC

## 2015-05-10 MED ORDER — ONDANSETRON HCL 4 MG PO TABS: 4.0000 mg | ORAL_TABLET | Freq: Three times a day (TID) | ORAL | Status: AC | PRN

## 2015-05-10 MED ORDER — DEXAMETHASONE SODIUM PHOSPHATE 4 MG/ML IJ SOLN
8.0000 mg | INTRAMUSCULAR | Status: DC
  Administered 2015-05-11: 8 mg via INTRAVENOUS
  Filled 2015-05-10: qty 2

## 2015-05-10 MED ORDER — DEXAMETHASONE SODIUM PHOSPHATE 4 MG/ML IJ SOLN
8.0000 mg | Freq: Once | INTRAMUSCULAR | Status: AC
  Administered 2015-05-10: 8 mg via INTRAVENOUS
  Filled 2015-05-10: qty 2

## 2015-05-10 MED ORDER — PREGABALIN 50 MG PO CAPS: 50.0000 mg | ORAL_CAPSULE | Freq: Two times a day (BID) | ORAL | Status: DC

## 2015-05-10 MED ORDER — LORAZEPAM 0.5 MG PO TABS
0.5000 mg | ORAL_TABLET | ORAL | Status: DC | PRN
  Administered 2015-05-10: 1 mg via ORAL
  Filled 2015-05-10: qty 2

## 2015-05-10 MED ORDER — CLONAZEPAM 0.5 MG PO TABS
0.5000 mg | ORAL_TABLET | Freq: Two times a day (BID) | ORAL | Status: DC
  Administered 2015-05-10 – 2015-05-11 (×4): 0.5 mg via ORAL
  Filled 2015-05-10 (×5): qty 1

## 2015-05-10 MED ORDER — FENTANYL BOLUS VIA INFUSION
100.0000 ug | INTRAVENOUS | Status: DC | PRN
  Administered 2015-05-10: 100 ug via INTRAVENOUS
  Filled 2015-05-10: qty 100

## 2015-05-10 MED ORDER — DEXAMETHASONE 4 MG PO TABS: 4.0000 mg | ORAL_TABLET | Freq: Every day | ORAL | Status: AC

## 2015-05-10 MED ORDER — MAGNESIUM HYDROXIDE 400 MG/5ML PO SUSP: 30.0000 mL | Freq: Every day | ORAL | Status: DC | PRN

## 2015-05-10 MED ORDER — PREGABALIN 50 MG PO CAPS
50.0000 mg | ORAL_CAPSULE | Freq: Three times a day (TID) | ORAL | Status: DC
  Administered 2015-05-10 – 2015-05-13 (×8): 50 mg via ORAL
  Filled 2015-05-10 (×8): qty 1

## 2015-05-10 NOTE — Progress Notes (Signed)
PATIENT DETAILS Name: Stuart Moore Age: 59 y.o. Sex: male Date of Birth: 09/03/1955 Admit Date: 05/08/2015 Admitting Physician Phillips Grout, MD [facility]  Brief narrative:  59 year old male with metastatic hepatocellular carcinoma admitted with severe abdominal pain. Palliative care consulted, pain regimen being a chested. Plans are to discharge home with hospice when pain more adequately controlled.  Subjective: Unfortunately-pain severe this morning. Was having good control of pain till last evening.  Assessment/Plan: Principal Problem: Intractable abdominal pain: Secondary to progressive stage IV hepatocellular carcinoma. Appreciate palliative care input, started on transdermal fentanyl-initially good pain control-however this morning with worsening pain, spoke with palliative care team-recommendations are to start IV fentanyl infusion for 24 hours before transitioning to oral narcotics. Decadron also increased, on bowel regimen with multiple bowel movements yesterday. Suspect some significant anxiety also contributing-add Klonopin. Abdomen is soft with doubt any significant distention.   Active Problems:  Essential hypertension: Controlled-continue with lisinopril, propranolol and amlodipine  Hiccups-persistent: Controlled with baclofen. Follow.  Metastatic Hepatocellular carcinoma: Unfortunately has progressive disease despite radiation and chemotherapy. Patient interested in home hospice services. Unfortunately past few days, patient with intractable abdominal pain and very poor oral intake.  Hyponatremia: Chronic issue. Minimal-continue to monitor.  Type 2 diabetes: CBGs stable, continue 32 units of Lantus and SSI. Follow.  GERD: Continue PPI  Constipation: Continue sorbitol, MOM and lactulose. Suspect related to narcotics.  Disposition: Remain inpatient-home with hospice when pain more adequately controlled  Antimicrobial  agents  See below  Anti-infectives    None      DVT Prophylaxis: SCD's  Code Status: DNR  Family Communication None at bedside  Procedures: None  CONSULTS:  Palliative care medicine  Time spent 30 minutes-Greater than 50% of this time was spent in counseling, explanation of diagnosis, planning of further management, and coordination of care.  MEDICATIONS: Scheduled Meds: . amLODipine  10 mg Oral Daily  . baclofen  10 mg Oral BID  . clonazePAM  0.5 mg Oral BID  . [START ON 05/11/2015] dexamethasone  8 mg Intravenous Q24H  . feeding supplement (ENSURE ENLIVE)  237 mL Oral TID BM  . ferrous fumarate  1 tablet Oral BID  . insulin aspart  0-9 Units Subcutaneous TID WC  . lactulose  20 g Oral TID  . lisinopril  40 mg Oral q morning - 10a  . magnesium hydroxide  30 mL Oral BID  . pantoprazole  40 mg Oral BID  . pregabalin  50 mg Oral TID  . propranolol  20 mg Oral Daily  . PRUTECT  1 application Topical Daily  . sodium chloride  3 mL Intravenous Q12H  . sorbitol  30 mL Oral Daily   Continuous Infusions: . fentaNYL infusion INTRAVENOUS 75 mcg/hr (05/10/15 1103)   PRN Meds:.sodium chloride, bisacodyl, EPINEPHrine, fentaNYL, fentaNYL (SUBLIMAZE) injection, insulin glargine, LORazepam, ondansetron, sodium chloride, sodium phosphate    PHYSICAL EXAM: Vital signs in last 24 hours: Filed Vitals:   05/09/15 0517 05/09/15 1403 05/09/15 2118 05/10/15 0516  BP: 119/72 104/68 99/65 108/77  Pulse: 103 78 79 80  Temp: 98.4 F (36.9 C) 98 F (36.7 C) 98.2 F (36.8 C) 97.9 F (36.6 C)  TempSrc: Oral Oral Oral Oral  Resp: 16 18 16 18   Height:      Weight:      SpO2: 95% 93% 96% 95%    Weight change:  [employer]  05/08/15 2332  Weight: 72.576 kg (160 lb)   Body mass index is 25.05 kg/(m^2).   Gen Exam: Awake and alert with clear speech.  Neck: Supple, No JVD.   Chest: B/L Clear.   CVS: S1 S2 Regular, no murmurs.  Abdomen: soft, BS +, non tender, mildly  distended.  Extremities: no edema, lower extremities warm to touch. Neurologic: Non Focal.   Skin: No Rash.   Wounds: N/A.    Intake/Output from previous day:  Intake/Output Summary (Last 24 hours) at 05/10/15 1143 Last data filed at 05/10/15 0516  Gross per 24 hour  Intake    700 ml  Output      0 ml  Net    700 ml     LAB RESULTS: CBC  Recent Labs Lab 05/08/15 1638  WBC 16.3*  HGB 11.9*  HCT 36.1*  PLT 485*  MCV 82.0  MCH 27.0  MCHC 33.0  RDW 17.8*  LYMPHSABS 0.8  MONOABS 2.0*  EOSABS 0.0  BASOSABS 0.0    Chemistries   Recent Labs Lab 05/08/15 1638 05/08/15 1815  NA 126* 129*  K SPECIMEN HEMOLYZED. HEMOLYSIS MAY AFFECT INTEGRITY OF RESULTS. 4.0  CL 84* 93*  CO2 32 29  GLUCOSE 262* 208*  BUN 8 7  CREATININE 0.87 0.58*  CALCIUM 8.7* 7.2*    CBG:  Recent Labs Lab 05/09/15 1743 05/09/15 2121 05/10/15 0724  GLUCAP 287* 268* 178*    GFR Estimated Creatinine Clearance: 91.8 mL/min (by C-G formula based on Cr of 0.58).  Coagulation profile No results for input(s): INR, PROTIME in the last 168 hours.  Cardiac Enzymes No results for input(s): CKMB, TROPONINI, MYOGLOBIN in the last 168 hours.  Invalid input(s): CK  Invalid input(s): POCBNP No results for input(s): DDIMER in the last 72 hours. No results for input(s): HGBA1C in the last 72 hours. No results for input(s): CHOL, HDL, LDLCALC, TRIG, CHOLHDL, LDLDIRECT in the last 72 hours. No results for input(s): TSH, T4TOTAL, T3FREE, THYROIDAB in the last 72 hours.  Invalid input(s): FREET3 No results for input(s): VITAMINB12, FOLATE, FERRITIN, TIBC, IRON, RETICCTPCT in the last 72 hours. No results for input(s): LIPASE, AMYLASE in the last 72 hours.  Urine Studies No results for input(s): UHGB, CRYS in the last 72 hours.  Invalid input(s): UACOL, UAPR, USPG, UPH, UTP, UGL, UKET, UBIL, UNIT, UROB, ULEU, UEPI, UWBC, URBC, UBAC, CAST, UCOM, BILUA  MICROBIOLOGY: No results found for this or  any previous visit (from the past 240 hour(s)).  RADIOLOGY STUDIES/RESULTS: Dg Chest 2 View  04/25/2015   CLINICAL DATA:  Fever. Progressive abdominal pain and diarrhea for 2 days. History of liver cancer, status post radiation therapy.  EXAM: CHEST  2 VIEW  COMPARISON:  02/12/2015  FINDINGS: Numerous leads and wires project over the chest. Mild right hemidiaphragm elevation. Midline trachea. Remote left rib trauma. Mild cardiomegaly. No pleural effusion or pneumothorax. Bibasilar volume loss with atelectasis. No lobar consolidation. Small bowel air-fluid levels are nonspecific.  IMPRESSION: Bibasilar volume loss with subsegmental atelectasis. No acute cardiopulmonary disease.  Nonspecific small bowel fluid levels.   Electronically Signed   By: Abigail Miyamoto M.D.   On: 04/25/2015 13:52   Ct Abdomen Pelvis W Contrast  04/25/2015   CLINICAL DATA:  59 year old with recent completion of palliative radiation therapy for metastatic hepatocellular carcinoma, presenting with fever, steadily worsening generalized abdominal pain and diarrhea over the past 3 days.  EXAM: CT ABDOMEN AND PELVIS WITH CONTRAST  TECHNIQUE: Multidetector CT imaging of  the abdomen and pelvis was performed using the standard protocol following bolus administration of intravenous contrast.  CONTRAST:  118mL OMNIPAQUE IOHEXOL 300 MG/ML IV. Oral contrast was also administered.  COMPARISON:  03/05/2015 and earlier.  FINDINGS: Hepatobiliary: Hepatic cirrhosis with relative enlargement of the left lobe and caudate lobe as noted previously. Interval enlargement of previously identified multiple liver masses, as well as multiple new masses when compared to the most recent previous CT 03/05/2015, the largest mass in the lateral segment left lobe measuring approximately 2.6 x 3.4 cm (less than 1 cm on the prior CT). Patent portal vein. Mild wall thickening and enhancement of the mucosa of the gallbladder. No calcified gallstones. No pericholecystic  inflammation.  Spleen:  Normal in size and appearance.  Pancreas:  Normal in appearance.  No pancreatic ductal dilation.  Adrenal glands: Normal left adrenal gland. Approximate 1.6 x 2.7 cm mass involving the right adrenal gland, increased in size since the prior CT.  Genitourinary: Nonobstructing approximate 2-3 mm calculus in an upper pole calix of the right kidney. No urinary tract calculi elsewhere on either side. No evidence of urinary tract obstruction. 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 Normal-appearing urinary bladder.  Prostate gland and seminal vesicles normal in size and appearance for age.  Gastrointestinal: Varices involving the distal esophagus. No intrinsic abnormality involving the stomach. Mild wall thickening involving the entire duodenum. Small bowel otherwise normal in appearance. Normal-appearing colon with moderate stool burden.  Ascites: Small amount of ascites dependently in the pelvis and in the perihepatic space.  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  Lymphatic: Bulky lymphadenopathy in the gastrohepatic ligament, hepatoduodenal ligament, porta hepatis, celiac axis and retroperitoneum of the upper abdomen with evidence of necrosis, likely related to the recent radiation therapy. Enlarged lymph nodes at the root of the mesentery have increased in size, and there is enlargement of left periaortic retroperitoneal lymph nodes. The lymphadenopathy causes moderate compression of the infrahepatic IVC and causes mass-effect upon the gastric antrum, duodenal bulb and descending duodenum.  Other findings: Small bilateral inguinal hernias containing fat. Increased edema and/or inflammation in the mesentery and the retroperitoneum since the prior examination.  Musculoskeletal: Intramedullary nail in the right femur. No evidence of osseous metastatic disease.  Visualized lower thorax: Heart mildly enlarged but stable.  Atelectasis/scarring in the right middle lobe and both lower lobes associated with bronchiectasis, unchanged.  IMPRESSION: 1. Bulky lymphadenopathy in the abdomen as detailed above. Since the most recent prior CT 03/05/2015, many of the nodes have become necrotic, likely as a result of radiation therapy. 2. Interval enlargement of previously identified liver masses and multiple new liver masses since the most recent prior CT. 3. Edema/inflammation involving the mesentery and the retroperitoneum, a new finding. This may be the result of radiation therapy or may reflect lymphatic obstruction related to the bulky lymphadenopathy. 4. Enlarging right adrenal metastasis. 5. Small amount of ascites in the pelvis and in the perihepatic space. 6. Stable mild gallbladder wall thickening which is likely related to the chronic hepatocellular disease. No calcified gallstones and no evidence of acute cholecystitis. 7. Stable atelectasis/scarring in the right middle lobe and both lower lobes with bronchiectasis.   Electronically Signed   By: Evangeline Dakin M.D.   On: 04/25/2015 15:53    Oren Binet, MD  [facility] Pager:[phone]  If 7PM-7AM, please contact night-coverage [email or web address] Password [email or web address] 05/10/2015, 11:43 AM   LOS: 1 day

## 2015-05-10 NOTE — Progress Notes (Signed)
Daily Progress Note   Patient Name: Stuart Moore       Date: 05/10/2015 DOB: [birth year]  Age: 59 y.o. MRN#: [id number] Attending Physician: Jonetta Osgood, MD Primary Care Physician: [place] Date: 05/08/2015  Reason for Consultation/Follow-up: Pain control and Psychosocial/spiritual support  Subjective: Pt was initiating feeling much better with addition of lyrica, decadron as well as fentanyl TD. He developed another pain crisis in the night and worsening into this morning. RN on first shift had already given 300 mcg of fentanyl iv by 0900. Pt is moaning, holding his abd when I entered the room. His abd is firm and distended. Good bowel sounds. LBM x 4 this am. Liver enlarged and very tender to palpation. Pt and wife with this level of pain felt as though they could not mange this at home at this point. After much discussion between Dr. Sloan Leiter, pt, and wife, agreed to hold DC until pain better controlled. Will dc fentanyl TD and start fentanyl continuous infusion  to more rapidly ascertain effective dose. Will also increase decadron and lyrica. Re-checked on pt after infusion stated and pt 's pain much improved.. Plan i still to go home when pain is manged either on fentnyal TD or pca to be managed by hospice.  Interval Events: Pain crisis Length of Stay: 1 day  Current Medications: Scheduled Meds:  . amLODipine  10 mg Oral Daily  . baclofen  10 mg Oral BID  . clonazePAM  0.5 mg Oral BID  . [START ON 05/11/2015] dexamethasone  8 mg Intravenous Q24H  . feeding supplement (ENSURE ENLIVE)  237 mL Oral TID BM  . ferrous fumarate  1 tablet Oral BID  . insulin aspart  0-9 Units Subcutaneous TID WC  . lactulose  20 g Oral TID  . lisinopril  40 mg Oral q morning - 10a  . magnesium hydroxide  30 mL Oral BID  . pantoprazole  40 mg Oral BID  . pregabalin  50 mg Oral TID  . propranolol  20 mg Oral Daily  . PRUTECT  1 application Topical Daily  . sodium chloride   3 mL Intravenous Q12H  . sorbitol  30 mL Oral Daily    Continuous Infusions: . fentaNYL infusion INTRAVENOUS 75 mcg/hr (05/10/15 1103)    PRN Meds: sodium chloride, bisacodyl, EPINEPHrine, fentaNYL, fentaNYL (SUBLIMAZE) injection, insulin glargine, LORazepam, ondansetron, sodium chloride, sodium phosphate  Palliative Performance Scale: 40-50%     Vital Signs: BP 108/77 mmHg  Pulse 80  Temp(Src) 97.9 F (36.6 C) (Oral)  Resp 18  Ht 5\' 7"  (1.702 m)  Wt 72.576 kg (160 lb)  BMI 25.05 kg/m2  SpO2 95% SpO2: SpO2: 95 % O2 Device: O2 Device: Not Delivered O2 Flow Rate:    Intake/output summary:  Intake/Output Summary (Last 24 hours) at 05/10/15 1353 Last data filed at 05/10/15 0516  Gross per 24 hour  Intake    700 ml  Output      0 ml  Net    700 ml   LBM:   Baseline Weight: Weight: 72.576 kg (160 lb) Most recent weight: Weight: 72.576 kg (160 lb)  Physical Exam: General: Well nourished older man in pain Resp: No work of breathing GI: Distended, BS x 4, HSM , RUQ tenderness              Additional Data Reviewed: Recent Labs     05/08/15  1638  05/08/15  1815  WBC  16.3*   --  HGB  11.9*   --   PLT  485*   --   NA  126*  129*  BUN  8  7  CREATININE  0.87  0.58*     Problem List:  Patient Active Problem List   Diagnosis Date Noted  . Hyponatremia   . Other constipation   . Continuous severe abdominal pain 05/08/2015  . Liver mass, left lobe   . Diabetes mellitus due to underlying condition without complications   . Leukocytosis 07/01/2014  . Fever 06/29/2014  . Hepatocellular carcinoma 06/28/2014  . Renal stone 06/28/2014  . Hydronephrosis 06/28/2014  . Diabetes mellitus 06/28/2014  . Anxiety state 06/28/2014  . Essential hypertension 06/28/2014  . Abdominal pain 06/27/2014  . HEPATITIS C 11/10/2007  . 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 DEPRESSION 11/10/2007  . INTERNAL HEMORRHOIDS 11/10/2007  . GERD 11/10/2007  . HEMOCCULT POSITIVE STOOL  11/10/2007  . SLEEP APNEA 11/10/2007  . HEADACHE, CHRONIC 11/10/2007  . HEART MURMUR, BENIGN 11/10/2007  . FRACTURE, RIGHT LEG 11/10/2007     Palliative Care Assessment & Plan    Code Status:  DNR  Goals of Care:  To go home with hospice support in the home once pain is managed  Symptom Management:  Pain: Needs improvement. Start fentanyl infusion 13mcg/he and 100 mcg q30 min; increase lyrica 50 TID, give 8mg  decadron now and increase daily dose this 8mg  qam. Monitor and titrate infusion for effect  Anxiety: Pt has been in a lot of pain, terminally ill, and anxious. Cont klonopin 0.5 BID and will add ativan 0.5 q4 prn. Monitor prn ativan and will up tirate klonopin if needed  Palliative Prophylaxis:  Bowel regimine  Psycho-social/Spiritual:  Desire for further Chaplaincy support:no   Prognosis: < 6 months Discharge Planning: Home with Hospice once pain is managed. Pt could go home on PCA/CAD pump that could be managed by hospice or will need to re-start fentanyl td once we ascertain effective dose.   Care plan was discussed with Dr. Sloan Leiter  Thank you for allowing the Palliative Medicine Team to assist in the care of this patient.Will continue to follow over the weekend for symptom mgt   Time In: 0900 Time Out: 1000 Total Time 60 Prolonged Time Billed  no     Greater than 50%  of this time was spent counseling and coordinating care related to the above assessment and plan.   Dory Horn, NP  05/10/2015, 1:53 PM  Please contact Palliative Medicine Team phone at [phone] for questions and concerns.

## 2015-05-10 NOTE — Progress Notes (Signed)
Notified by [place] of family request for [facility] services at home after discharge. Chart and patient information currently under review to confirm hospice eligibility.   Spoke with Jenny Reichmann, at bedside to initiate education related to hospice philosophy, services and team approach to care. Family verbalized understanding of the information provided. Per discussion plan is for discharge to home once pain is better controlled.  Please send signed completed DNR form home with patient.  Patient will need prescriptions for discharge comfort medications.   DME needs discussed and family requested BSC, tubseat, light-weight walker and light-weight W/C for delivery to the home today or tomorrow. [facility] equipment manager Jewel Ysidro Evert notified and will contact [facility] to arrange delivery to the home.  The home address has been verified and is correct in the chart; Jenny Reichmann is the family member to be contacted to arrange time of delivery.   [facility] aware of the above.  Completed discharge summary will need to be faxed to [facility] at [phone] when final.  Please notify [facility] when patient is ready to leave unit at discharge-call [phone].   [facility] information and contact numbers have been given to [place] during visit.   [facility] will continue to follow.  Please call with any questions.  Annia Belt RN, [facility] Liaison  [phone]

## 2015-05-10 NOTE — Care Management Note (Signed)
Case Management Note  Patient Details  Name: Stuart Moore MRN: [id number] Date of Birth: Sep 14, 1955     Expected Discharge Date:                  Expected Discharge Plan:  Home w Hospice Care  In-House Referral:     Discharge planning Services  CM Consult  Post Acute Care Choice:  Hospice Choice offered to:  Patient  DME Arranged:    DME Agency:     HH Arranged:  Disease Management [place] Agency:   [facility]  Status of Service:  In process, will continue to follow  Medicare Important Message Given:    Date Medicare IM Given:    Medicare IM give by:    Date Additional Medicare IM Given:    Additional Medicare Important Message give by:     If discussed at [place] of Stay Meetings, dates discussed:    Additional Comments:  This CM met with pt again to follow up for Home Hospice choice. Pt also expresses need for tub chair and bedside commode at home. Pt states the he would like to go with [facility] for hospice services. Referral called into [facility] referral line. Pt was to DC home today but DC cancelled due to pt with poor pain control. Anticipate DC tomorrow. CM will continue to follow.  Lynnell Catalan, RN 05/10/2015, 10:15 AM

## 2015-05-11 DIAGNOSIS — C801 Malignant (primary) neoplasm, unspecified: Secondary | ICD-10-CM

## 2015-05-11 DIAGNOSIS — C799 Secondary malignant neoplasm of unspecified site: Secondary | ICD-10-CM | POA: Insufficient documentation

## 2015-05-11 MED ORDER — BACLOFEN 10 MG PO TABS: 10.0000 mg | ORAL_TABLET | Freq: Two times a day (BID) | ORAL | Status: DC | PRN

## 2015-05-11 MED ORDER — SORBITOL 70 % SOLN: 30.0000 mL | Freq: Every day | Status: DC | PRN

## 2015-05-11 MED ORDER — LACTULOSE 10 GM/15ML PO SOLN
20.0000 g | Freq: Two times a day (BID) | ORAL | Status: DC
  Filled 2015-05-11 (×3): qty 30

## 2015-05-11 MED ORDER — METOCLOPRAMIDE HCL 5 MG PO TABS
5.0000 mg | ORAL_TABLET | Freq: Two times a day (BID) | ORAL | Status: DC
  Administered 2015-05-11 (×2): 5 mg via ORAL
  Filled 2015-05-11 (×2): qty 1

## 2015-05-11 MED ORDER — FENTANYL BOLUS VIA INFUSION
150.0000 ug | INTRAVENOUS | Status: DC | PRN
  Administered 2015-05-11 – 2015-05-12 (×9): 150 ug via INTRAVENOUS
  Filled 2015-05-11: qty 150

## 2015-05-11 MED ORDER — BACLOFEN 10 MG PO TABS
10.0000 mg | ORAL_TABLET | Freq: Three times a day (TID) | ORAL | Status: DC
  Administered 2015-05-11 – 2015-05-13 (×7): 10 mg via ORAL
  Filled 2015-05-11 (×7): qty 1

## 2015-05-11 NOTE — Progress Notes (Signed)
Daily Progress Note   Patient Name: Stuart Moore       Date: 05/11/2015 DOB: [birth year]  Age: 59 y.o. MRN#: [id number] Attending Physician: Jonetta Osgood, MD Primary Care Physician: [place] Date: 05/08/2015  Reason for Consultation/Follow-up: Establishing goals of care, Pain control and Psychosocial/spiritual support  Subjective: Pain improving on continuous infusion. Did require an up titration yesterday. Pt reports his pain has gone from a 10/10 to now a 5/10. He feels that his pain goal is 3/10. Movement makes his pain worse. He denies nausea. He is also having hiccoughs which he reports is not new. States he "takes at least 3 tabs a day". Constipation relieved. He had BM this morning as well as 4 yesterday.  Slept better last night. No increased anxiety. ! Prn ativan noted Interval Events: Continuous fentanyl infusion Length of Stay: 2 days  Current Medications: Scheduled Meds:  . amLODipine  10 mg Oral Daily  . baclofen  10 mg Oral TID  . clonazePAM  0.5 mg Oral BID  . dexamethasone  8 mg Intravenous Q24H  . feeding supplement (ENSURE ENLIVE)  237 mL Oral TID BM  . ferrous fumarate  1 tablet Oral BID  . insulin aspart  0-9 Units Subcutaneous TID WC  . lactulose  20 g Oral BID  . lisinopril  40 mg Oral q morning - 10a  . magnesium hydroxide  30 mL Oral BID  . metoCLOPramide  5 mg Oral BID  . pantoprazole  40 mg Oral BID  . pregabalin  50 mg Oral TID  . propranolol  20 mg Oral Daily  . PRUTECT  1 application Topical Daily  . sodium chloride  3 mL Intravenous Q12H    Continuous Infusions: . fentaNYL infusion INTRAVENOUS 125 mcg/hr (05/11/15 0340)    PRN Meds: sodium chloride, baclofen, bisacodyl, EPINEPHrine, fentaNYL, insulin glargine, LORazepam, ondansetron, sodium chloride, sodium phosphate, sorbitol  Palliative Performance Scale: 40-50%     Vital Signs: BP 123/80 mmHg  Pulse 96  Temp(Src) 97.8 F (36.6 C) (Oral)  Resp 16  Ht  5\' 7"  (1.702 m)  Wt 72.576 kg (160 lb)  BMI 25.05 kg/m2  SpO2 97% SpO2: SpO2: 97 % O2 Device: O2 Device: Not Delivered O2 Flow Rate:    Intake/output summary:  Intake/Output Summary (Last 24 hours) at 05/11/15 0950 Last data filed at 05/11/15 0521  Gross per 24 hour  Intake    600 ml  Output    525 ml  Net     75 ml   LBM:   Baseline Weight: Weight: 72.576 kg (160 lb) Most recent weight: Weight: 72.576 kg (160 lb)  Physical Exam: General: Well nourished older man. He is alert, not in distress Resp: No work of breathing GI: Abd firm and distended. Mild TTP to RUQ. Bowel sounds x 4              Additional Data Reviewed: Recent Labs     05/08/15  1638  05/08/15  1815  WBC  16.3*   --   HGB  11.9*   --   PLT  485*   --   NA  126*  129*  BUN  8  7  CREATININE  0.87  0.58*     Problem List:  Patient Active Problem List   Diagnosis Date Noted  . Palliative care encounter   . Hyponatremia   . Other constipation   . Continuous severe abdominal pain 05/08/2015  .  Liver mass, left lobe   . Diabetes mellitus due to underlying condition without complications   . Leukocytosis 07/01/2014  . Fever 06/29/2014  . Hepatocellular carcinoma 06/28/2014  . Renal stone 06/28/2014  . Hydronephrosis 06/28/2014  . Diabetes mellitus 06/28/2014  . Anxiety state 06/28/2014  . Essential hypertension 06/28/2014  . Abdominal pain 06/27/2014  . HEPATITIS C 11/10/2007  . 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 DEPRESSION 11/10/2007  . INTERNAL HEMORRHOIDS 11/10/2007  . GERD 11/10/2007  . HEMOCCULT POSITIVE STOOL 11/10/2007  . SLEEP APNEA 11/10/2007  . HEADACHE, CHRONIC 11/10/2007  . HEART MURMUR, BENIGN 11/10/2007  . FRACTURE, RIGHT LEG 11/10/2007     Palliative Care Assessment & Plan    Code Status:  DNR  Goals of Care:  Home with [facility]  Pt will go home on pca fentanyl. Notified [facility] of pt's desire to go home on pump and states they will deliver to unit  once dose known  Symptom Management:  Pain: Needs improvement. Will increase fentanyl continuous infusion to 150 mcg/hr and 150 mcg q 30 min prn. Cont lyrica 50 mg TID and decadron 8 mg daily  Hiccoughs: Increase baclofen to 10 mg TID and will add prn bid dosing for breakthrough. Will also add low dose regaln 5mg  q12   Constipation: Improving. Decrease lactulose to 20 gm BID and change sorbitol to daily prn. Reglan will also be beneficial for bowels going forward  Anxiety: Cont klonopin 0.5 BID and ativan q4 prn  Palliative Prophylaxis:  Bowel regimen in place. See above  Psycho-social/Spiritual:  Desire for further Chaplaincy support:no   Prognosis: < 6 months Discharge Planning: Home with Hospice on fentanyl pca   Care plan was discussed with Dr. Coralyn Pear  Thank you for allowing the Palliative Medicine Team to assist in the care of this patient.   Time In: 0900 Time Out: 0945 Total Time 45 min Prolonged Time Billed  no     Greater than 50%  of this time was spent counseling and coordinating care related to the above assessment and plan.   Dory Horn, NP  05/11/2015, 9:50 AM  Please contact Palliative Medicine Team phone at [phone] for questions and concerns.

## 2015-05-11 NOTE — Progress Notes (Signed)
PATIENT DETAILS Name: Stuart Moore Age: 59 y.o. Sex: male Date of Birth: 28-Jan-1956 Admit Date: 05/08/2015 Admitting Physician Phillips Grout, MD [facility]  Brief narrative:  59 year old male with metastatic hepatocellular carcinoma admitted with severe abdominal pain. Palliative care consulted, pain regimen being a chested. Plans are to discharge home with hospice when pain more adequately controlled.  Subjective: Unfortunately-pain severe this morning. Was having good control of pain till last evening.  Assessment/Plan: Principal Problem: Intractable abdominal pain:  -Secondary to progressive stage IV hepatocellular carcinoma. -Palliative care consulted, he was started on IV fentanyl infusion with 150 mg IV every half hour for severe breakthrough pain   Active Problems:  Essential hypertension:  -Controlled-continue with lisinopril, propranolol and amlodipine  Hiccups-persistent:  -Controlled with baclofen  Metastatic Hepatocellular carcinoma:  -Patient with advanced/end stage disease, goals of care discussed with patient during this hospitalization as he his wishes are to go home with hospice services. Care was consulted to assist with pain management -He is currently on fentanyl IV  Disposition: Remain inpatient-home with hospice when pain more adequately controlled  Antimicrobial agents  See below  Anti-infectives    None      DVT Prophylaxis: SCD's  Code Status: DNR  Family Communication None at bedside  Procedures: None  CONSULTS:  Palliative care medicine  Time spent 20 minutes  MEDICATIONS: Scheduled Meds: . amLODipine  10 mg Oral Daily  . baclofen  10 mg Oral TID  . clonazePAM  0.5 mg Oral BID  . dexamethasone  8 mg Intravenous Q24H  . feeding supplement (ENSURE ENLIVE)  237 mL Oral TID BM  . ferrous fumarate  1 tablet Oral BID  . insulin aspart  0-9 Units Subcutaneous TID WC  . lactulose  20 g  Oral BID  . lisinopril  40 mg Oral q morning - 10a  . magnesium hydroxide  30 mL Oral BID  . metoCLOPramide  5 mg Oral BID  . pantoprazole  40 mg Oral BID  . pregabalin  50 mg Oral TID  . propranolol  20 mg Oral Daily  . PRUTECT  1 application Topical Daily  . sodium chloride  3 mL Intravenous Q12H   Continuous Infusions: . fentaNYL infusion INTRAVENOUS 150 mcg/hr (05/11/15 1113)   PRN Meds:.sodium chloride, baclofen, bisacodyl, EPINEPHrine, fentaNYL, insulin glargine, LORazepam, ondansetron, sodium chloride, sodium phosphate, sorbitol    PHYSICAL EXAM: Vital signs in last 24 hours: Filed Vitals:   05/10/15 0516 05/10/15 1445 05/10/15 2037 05/11/15 0521  BP: 108/77 110/78 123/82 123/80  Pulse: 80 82 94 96  Temp: 97.9 F (36.6 C) 97.8 F (36.6 C) 97.7 F (36.5 C) 97.8 F (36.6 C)  TempSrc: Oral Oral Oral Oral  Resp: 18 18 16 16   Height:      Weight:      SpO2: 95% 96% 97% 97%    Weight change:  Filed Weights   05/08/15 2332  Weight: 72.576 kg (160 lb)   Body mass index is 25.05 kg/(m^2).   Gen Exam: Awake and alert with clear speech. He complains of abdominal pain asking for IV pain meds  Neck: Supple, No JVD.   Chest: B/L Clear.   CVS: S1 S2 Regular, no murmurs.  Abdomen: There is pain to palpation across generalized abdomen Extremities: no edema, lower extremities warm to touch. Neurologic: Non Focal.   Skin: No Rash.   Wounds: N/A.  Intake/Output from previous day:  Intake/Output Summary (Last 24 hours) at 05/11/15 1344 Last data filed at 05/11/15 0900  Gross per 24 hour  Intake    600 ml  Output    925 ml  Net   -325 ml     LAB RESULTS: CBC  Recent Labs Lab 05/08/15 1638  WBC 16.3*  HGB 11.9*  HCT 36.1*  PLT 485*  MCV 82.0  MCH 27.0  MCHC 33.0  RDW 17.8*  LYMPHSABS 0.8  MONOABS 2.0*  EOSABS 0.0  BASOSABS 0.0    Chemistries   Recent Labs Lab 05/08/15 1638 05/08/15 1815  NA 126* 129*  K SPECIMEN HEMOLYZED. HEMOLYSIS MAY  AFFECT INTEGRITY OF RESULTS. 4.0  CL 84* 93*  CO2 32 29  GLUCOSE 262* 208*  BUN 8 7  CREATININE 0.87 0.58*  CALCIUM 8.7* 7.2*    CBG:  Recent Labs Lab 05/09/15 1743 05/09/15 2121 05/10/15 0724 05/10/15 1200 05/10/15 1651  GLUCAP 287* 268* 178* 230* 280*    GFR Estimated Creatinine Clearance: 91.8 mL/min (by C-G formula based on Cr of 0.58).  Coagulation profile No results for input(s): INR, PROTIME in the last 168 hours.  Cardiac Enzymes No results for input(s): CKMB, TROPONINI, MYOGLOBIN in the last 168 hours.  Invalid input(s): CK  Invalid input(s): POCBNP No results for input(s): DDIMER in the last 72 hours. No results for input(s): HGBA1C in the last 72 hours. No results for input(s): CHOL, HDL, LDLCALC, TRIG, CHOLHDL, LDLDIRECT in the last 72 hours. No results for input(s): TSH, T4TOTAL, T3FREE, THYROIDAB in the last 72 hours.  Invalid input(s): FREET3 No results for input(s): VITAMINB12, FOLATE, FERRITIN, TIBC, IRON, RETICCTPCT in the last 72 hours. No results for input(s): LIPASE, AMYLASE in the last 72 hours.  Urine Studies No results for input(s): UHGB, CRYS in the last 72 hours.  Invalid input(s): UACOL, UAPR, USPG, UPH, UTP, UGL, UKET, UBIL, UNIT, UROB, ULEU, UEPI, UWBC, URBC, UBAC, CAST, UCOM, BILUA  MICROBIOLOGY: No results found for this or any previous visit (from the past 240 hour(s)).  RADIOLOGY STUDIES/RESULTS: Dg Chest 2 View  04/25/2015   CLINICAL DATA:  Fever. Progressive abdominal pain and diarrhea for 2 days. History of liver cancer, status post radiation therapy.  EXAM: CHEST  2 VIEW  COMPARISON:  02/12/2015  FINDINGS: Numerous leads and wires project over the chest. Mild right hemidiaphragm elevation. Midline trachea. Remote left rib trauma. Mild cardiomegaly. No pleural effusion or pneumothorax. Bibasilar volume loss with atelectasis. No lobar consolidation. Small bowel air-fluid levels are nonspecific.  IMPRESSION: Bibasilar volume loss  with subsegmental atelectasis. No acute cardiopulmonary disease.  Nonspecific small bowel fluid levels.   Electronically Signed   By: Zahki Hoogendoorn Miyamoto M.D.   On: 04/25/2015 13:52   Ct Abdomen Pelvis W Contrast  04/25/2015   CLINICAL DATA:  59 year old with recent completion of palliative radiation therapy for metastatic hepatocellular carcinoma, presenting with fever, steadily worsening generalized abdominal pain and diarrhea over the past 3 days.  EXAM: CT ABDOMEN AND PELVIS WITH CONTRAST  TECHNIQUE: Multidetector CT imaging of the abdomen and pelvis was performed using the standard protocol following bolus administration of intravenous contrast.  CONTRAST:  143mL OMNIPAQUE IOHEXOL 300 MG/ML IV. Oral contrast was also administered.  COMPARISON:  03/05/2015 and earlier.  FINDINGS: Hepatobiliary: Hepatic cirrhosis with relative enlargement of the left lobe and caudate lobe as noted previously. Interval enlargement of previously identified multiple liver masses, as well as multiple new masses when compared to the most recent  previous CT 03/05/2015, the largest mass in the lateral segment left lobe measuring approximately 2.6 x 3.4 cm (less than 1 cm on the prior CT). Patent portal vein. Mild wall thickening and enhancement of the mucosa of the gallbladder. No calcified gallstones. No pericholecystic inflammation.  Spleen:  Normal in size and appearance.  Pancreas:  Normal in appearance.  No pancreatic ductal dilation.  Adrenal glands: Normal left adrenal gland. Approximate 1.6 x 2.7 cm mass involving the right adrenal gland, increased in size since the prior CT.  Genitourinary: Nonobstructing approximate 2-3 mm calculus in an upper pole calix of the right kidney. No urinary tract calculi elsewhere on either side. No evidence of urinary tract obstruction. 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 Normal-appearing urinary bladder.  Prostate gland and seminal vesicles normal in size and  appearance for age.  Gastrointestinal: Varices involving the distal esophagus. No intrinsic abnormality involving the stomach. Mild wall thickening involving the entire duodenum. Small bowel otherwise normal in appearance. Normal-appearing colon with moderate stool burden.  Ascites: Small amount of ascites dependently in the pelvis and in the perihepatic space.  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  Lymphatic: Bulky lymphadenopathy in the gastrohepatic ligament, hepatoduodenal ligament, porta hepatis, celiac axis and retroperitoneum of the upper abdomen with evidence of necrosis, likely related to the recent radiation therapy. Enlarged lymph nodes at the root of the mesentery have increased in size, and there is enlargement of left periaortic retroperitoneal lymph nodes. The lymphadenopathy causes moderate compression of the infrahepatic IVC and causes mass-effect upon the gastric antrum, duodenal bulb and descending duodenum.  Other findings: Small bilateral inguinal hernias containing fat. Increased edema and/or inflammation in the mesentery and the retroperitoneum since the prior examination.  Musculoskeletal: Intramedullary nail in the right femur. No evidence of osseous metastatic disease.  Visualized lower thorax: Heart mildly enlarged but stable. Atelectasis/scarring in the right middle lobe and both lower lobes associated with bronchiectasis, unchanged.  IMPRESSION: 1. Bulky lymphadenopathy in the abdomen as detailed above. Since the most recent prior CT 03/05/2015, many of the nodes have become necrotic, likely as a result of radiation therapy. 2. Interval enlargement of previously identified liver masses and multiple new liver masses since the most recent prior CT. 3. Edema/inflammation involving the mesentery and the retroperitoneum, a new finding. This may be the result of radiation therapy or may reflect lymphatic obstruction related to the bulky lymphadenopathy. 4.  Enlarging right adrenal metastasis. 5. Small amount of ascites in the pelvis and in the perihepatic space. 6. Stable mild gallbladder wall thickening which is likely related to the chronic hepatocellular disease. No calcified gallstones and no evidence of acute cholecystitis. 7. Stable atelectasis/scarring in the right middle lobe and both lower lobes with bronchiectasis.   Electronically Signed   By: Evangeline Dakin M.D.   On: 04/25/2015 15:53    Kelvin Cellar, MD  [facility] Pager:[phone]  If 7PM-7AM, please contact night-coverage [email or web address] Password [email or web address] 05/11/2015, 1:44 PM   LOS: 2 days

## 2015-05-11 NOTE — Consult Note (Signed)
[facility] received request from [facility] 05/10/15 for [facility] services after discharge. [facility] RN met with patient and family and confirmed eligibility 05/10/15 with plan to discharge home once pain is better controlled. Appreciate Palliative Medicine Team assistance with pain control. Spoke with spouse Jenny Reichmann 05/11/15 to confirm DME has been delivered to the home this morning. Jenny Reichmann is home this afternoon getting house arranged for possible discharge home tomorrow once pain is better controlled. [facility] RN will follow up tomorrow and get scripts and finalize arrangements for CADD pump.   Completed discharge summary will need to be faxed to [facility] at [phone].  Please notify [facility] when patient is ready to leave unit at discharge - please call [phone].  Spouse Jenny Reichmann has been given [facility] contact information.   [facility] will continue to follow.     Thank you.  Erling Conte, [facility] Liaison [phone]

## 2015-05-12 ENCOUNTER — Inpatient Hospital Stay (HOSPITAL_COMMUNITY): Payer: Medicare Other

## 2015-05-12 DIAGNOSIS — R52 Pain, unspecified: Secondary | ICD-10-CM

## 2015-05-12 DIAGNOSIS — R140 Abdominal distension (gaseous): Secondary | ICD-10-CM | POA: Insufficient documentation

## 2015-05-12 MED ORDER — MAGNESIUM HYDROXIDE 400 MG/5ML PO SUSP
30.0000 mL | Freq: Every day | ORAL | Status: DC
  Administered 2015-05-14: 30 mL via ORAL
  Filled 2015-05-12 (×3): qty 30

## 2015-05-12 MED ORDER — DEXAMETHASONE SODIUM PHOSPHATE 4 MG/ML IJ SOLN
8.0000 mg | Freq: Every morning | INTRAMUSCULAR | Status: DC
  Administered 2015-05-12 – 2015-05-16 (×5): 8 mg via INTRAVENOUS
  Filled 2015-05-12 (×6): qty 2

## 2015-05-12 MED ORDER — DEXAMETHASONE 4 MG PO TABS: 8.0000 mg | ORAL_TABLET | Freq: Every day | ORAL | Status: DC

## 2015-05-12 MED ORDER — FENTANYL BOLUS VIA INFUSION
150.0000 ug | INTRAVENOUS | Status: DC | PRN
  Administered 2015-05-12 – 2015-05-14 (×25): 150 ug via INTRAVENOUS
  Filled 2015-05-12: qty 150

## 2015-05-12 MED ORDER — CLONAZEPAM 0.5 MG PO TABS
0.5000 mg | ORAL_TABLET | Freq: Every morning | ORAL | Status: DC
  Administered 2015-05-13: 0.5 mg via ORAL
  Filled 2015-05-12: qty 1

## 2015-05-12 MED ORDER — LORAZEPAM 2 MG/ML IJ SOLN
0.5000 mg | INTRAMUSCULAR | Status: DC | PRN
  Administered 2015-05-12 – 2015-05-16 (×10): 1 mg via INTRAVENOUS
  Filled 2015-05-12 (×8): qty 1

## 2015-05-12 MED ORDER — CLONAZEPAM 1 MG PO TABS
1.0000 mg | ORAL_TABLET | Freq: Every day | ORAL | Status: DC
  Administered 2015-05-12: 1 mg via ORAL
  Filled 2015-05-12: qty 1

## 2015-05-12 NOTE — Progress Notes (Signed)
PATIENT DETAILS Name: Stuart Moore Age: 59 y.o. Sex: male Date of Birth: Dec 31, 1955 Admit Date: 05/08/2015 Admitting Physician Phillips Grout, MD [id number]  Brief narrative:  59 year old male with metastatic hepatocellular carcinoma admitted with severe abdominal pain. Palliative care consulted, pain regimen being a chested. Plans are to discharge home with hospice when pain more adequately controlled.  Subjective: Patient's complaining of severe abdominal pain today, states "not feeling right" he denies fevers, chills, nausea, vomiting. No infusion is currently running and has 150 mg IV every half hour for breakthrough pain.  Assessment/Plan: Principal Problem: Intractable abdominal pain:  -Secondary to progressive stage IV hepatocellular carcinoma. -On today's evaluation it appeared his pain symptoms had worsened possibly having increased abdominal distention. This was further worked up with a KUB which revealed nonobstructive gas pattern, abdominal perforation not obvious on study. He remains afebrile and hemodynamically stable. I discussed case with palliative care, discharge will need to be held as patient's pain symptoms still not under control. He seemed to do better with an increase in his IV Ativan dose. -I wonder if he'll be able to manage his symptoms at home and if residential hospice would be a better plan for him.   Active Problems:  Essential hypertension:  -Controlled-continue with lisinopril, propranolol and amlodipine  Hiccups-persistent:  -Controlled with baclofen  Metastatic Hepatocellular carcinoma:  -Patient with advanced/end stage disease, goals of care discussed with patient during this hospitalization as he his wishes are to go home with hospice services. Care was consulted to assist with pain management -Palliative care following, he is on fentanyl IV  Disposition: Remain inpatient-home with hospice when pain more  adequately controlled  Antimicrobial agents  See below  Anti-infectives    None      DVT Prophylaxis: SCD's  Code Status: DNR  Family Communication None at bedside  Procedures: None  CONSULTS:  Palliative care medicine  Time spent 20 minutes  MEDICATIONS: Scheduled Meds: . amLODipine  10 mg Oral Daily  . baclofen  10 mg Oral TID  . [START ON 05/13/2015] clonazePAM  0.5 mg Oral q morning - 10a  . clonazePAM  1 mg Oral QHS  . dexamethasone  8 mg Intravenous q morning - 10a  . feeding supplement (ENSURE ENLIVE)  237 mL Oral TID BM  . ferrous fumarate  1 tablet Oral BID  . insulin aspart  0-9 Units Subcutaneous TID WC  . lactulose  20 g Oral BID  . lisinopril  40 mg Oral q morning - 10a  . magnesium hydroxide  30 mL Oral Daily  . pantoprazole  40 mg Oral BID  . pregabalin  50 mg Oral TID  . propranolol  20 mg Oral Daily  . PRUTECT  1 application Topical Daily  . sodium chloride  3 mL Intravenous Q12H   Continuous Infusions: . fentaNYL infusion INTRAVENOUS 150 mcg/hr (05/12/15 1023)   PRN Meds:.sodium chloride, baclofen, bisacodyl, EPINEPHrine, fentaNYL, insulin glargine, LORazepam, ondansetron, sodium chloride, sodium phosphate, sorbitol    PHYSICAL EXAM: Vital signs in last 24 hours: Filed Vitals:   05/11/15 1320 05/11/15 2007 05/12/15 0520 05/12/15 1140  BP: 130/87 100/71 128/78 114/79  Pulse: 98 71 81   Temp: 97.6 F (36.4 C) 98 F (36.7 C) 97.9 F (36.6 C)   TempSrc: Oral Oral Oral   Resp: 18 18 18    Height:      Weight:  SpO2: 98% 97% 98%     Weight change:  Filed Weights   05/08/15 2332  Weight: 72.576 kg (160 lb)   Body mass index is 25.05 kg/(m^2).   Gen Exam: Patient's appears uncomfortable, complains of abdominal pain, he is awake and alert mentating well Neck: Supple, No JVD.   Chest: B/L Clear.   CVS: S1 S2 Regular, no murmurs.  Abdomen: He continues to have severe pain with palpation across his abdomen, with the presence  of guarding Extremities: no edema, lower extremities warm to touch. Neurologic: Non Focal.   Skin: No Rash.   Wounds: N/A.    Intake/Output from previous day:  Intake/Output Summary (Last 24 hours) at 05/12/15 1343 Last data filed at 05/12/15 1230  Gross per 24 hour  Intake      3 ml  Output   1700 ml  Net  -1697 ml     LAB RESULTS: CBC  Recent Labs Lab 05/08/15 1638  WBC 16.3*  HGB 11.9*  HCT 36.1*  PLT 485*  MCV 82.0  MCH 27.0  MCHC 33.0  RDW 17.8*  LYMPHSABS 0.8  MONOABS 2.0*  EOSABS 0.0  BASOSABS 0.0    Chemistries   Recent Labs Lab 05/08/15 1638 05/08/15 1815  NA 126* 129*  K SPECIMEN HEMOLYZED. HEMOLYSIS MAY AFFECT INTEGRITY OF RESULTS. 4.0  CL 84* 93*  CO2 32 29  GLUCOSE 262* 208*  BUN 8 7  CREATININE 0.87 0.58*  CALCIUM 8.7* 7.2*    CBG:  Recent Labs Lab 05/09/15 1743 05/09/15 2121 05/10/15 0724 05/10/15 1200 05/10/15 1651  GLUCAP 287* 268* 178* 230* 280*    GFR Estimated Creatinine Clearance: 91.8 mL/min (by C-G formula based on Cr of 0.58).  Coagulation profile No results for input(s): INR, PROTIME in the last 168 hours.  Cardiac Enzymes No results for input(s): CKMB, TROPONINI, MYOGLOBIN in the last 168 hours.  Invalid input(s): CK  Invalid input(s): POCBNP No results for input(s): DDIMER in the last 72 hours. No results for input(s): HGBA1C in the last 72 hours. No results for input(s): CHOL, HDL, LDLCALC, TRIG, CHOLHDL, LDLDIRECT in the last 72 hours. No results for input(s): TSH, T4TOTAL, T3FREE, THYROIDAB in the last 72 hours.  Invalid input(s): FREET3 No results for input(s): VITAMINB12, FOLATE, FERRITIN, TIBC, IRON, RETICCTPCT in the last 72 hours. No results for input(s): LIPASE, AMYLASE in the last 72 hours.  Urine Studies No results for input(s): UHGB, CRYS in the last 72 hours.  Invalid input(s): UACOL, UAPR, USPG, UPH, UTP, UGL, UKET, UBIL, UNIT, UROB, ULEU, UEPI, UWBC, URBC, UBAC, CAST, UCOM,  BILUA  MICROBIOLOGY: No results found for this or any previous visit (from the past 240 hour(s)).  RADIOLOGY STUDIES/RESULTS: Dg Chest 2 View  04/25/2015   CLINICAL DATA:  Fever. Progressive abdominal pain and diarrhea for 2 days. History of liver cancer, status post radiation therapy.  EXAM: CHEST  2 VIEW  COMPARISON:  02/12/2015  FINDINGS: Numerous leads and wires project over the chest. Mild right hemidiaphragm elevation. Midline trachea. Remote left rib trauma. Mild cardiomegaly. No pleural effusion or pneumothorax. Bibasilar volume loss with atelectasis. No lobar consolidation. Small bowel air-fluid levels are nonspecific.  IMPRESSION: Bibasilar volume loss with subsegmental atelectasis. No acute cardiopulmonary disease.  Nonspecific small bowel fluid levels.   Electronically Signed   By: Abigail Miyamoto M.D.   On: 04/25/2015 13:52   Dg Abd 1 View  05/12/2015   CLINICAL DATA:  Abdominal distension and pain  EXAM: ABDOMEN - 1  VIEW  COMPARISON:  04/25/2015  FINDINGS: Primarily gasless abdomen with small amount of air throughout colon. Diffuse increased attenuation over the abdomen with bulging flanks.  IMPRESSION: Nonobstructive gas pattern.  Possibility of ascites not excluded.   Electronically Signed   By: Skipper Cliche M.D.   On: 05/12/2015 10:30   Ct Abdomen Pelvis W Contrast  04/25/2015   CLINICAL DATA:  59 year old with recent completion of palliative radiation therapy for metastatic hepatocellular carcinoma, presenting with fever, steadily worsening generalized abdominal pain and diarrhea over the past 3 days.  EXAM: CT ABDOMEN AND PELVIS WITH CONTRAST  TECHNIQUE: Multidetector CT imaging of the abdomen and pelvis was performed using the standard protocol following bolus administration of intravenous contrast.  CONTRAST:  136mL OMNIPAQUE IOHEXOL 300 MG/ML IV. Oral contrast was also administered.  COMPARISON:  03/05/2015 and earlier.  FINDINGS: Hepatobiliary: Hepatic cirrhosis with relative  enlargement of the left lobe and caudate lobe as noted previously. Interval enlargement of previously identified multiple liver masses, as well as multiple new masses when compared to the most recent previous CT 03/05/2015, the largest mass in the lateral segment left lobe measuring approximately 2.6 x 3.4 cm (less than 1 cm on the prior CT). Patent portal vein. Mild wall thickening and enhancement of the mucosa of the gallbladder. No calcified gallstones. No pericholecystic inflammation.  Spleen:  Normal in size and appearance.  Pancreas:  Normal in appearance.  No pancreatic ductal dilation.  Adrenal glands: Normal left adrenal gland. Approximate 1.6 x 2.7 cm mass involving the right adrenal gland, increased in size since the prior CT.  Genitourinary: Nonobstructing approximate 2-3 mm calculus in an upper pole calix of the right kidney. No urinary tract calculi elsewhere on either side. No evidence of urinary tract obstruction. 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 Normal-appearing urinary bladder.  Prostate gland and seminal vesicles normal in size and appearance for age.  Gastrointestinal: Varices involving the distal esophagus. No intrinsic abnormality involving the stomach. Mild wall thickening involving the entire duodenum. Small bowel otherwise normal in appearance. Normal-appearing colon with moderate stool burden.  Ascites: Small amount of ascites dependently in the pelvis and in the perihepatic space.  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  Lymphatic: Bulky lymphadenopathy in the gastrohepatic ligament, hepatoduodenal ligament, porta hepatis, celiac axis and retroperitoneum of the upper abdomen with evidence of necrosis, likely related to the recent radiation therapy. Enlarged lymph nodes at the root of the mesentery have increased in size, and there is enlargement of left periaortic retroperitoneal lymph nodes. The lymphadenopathy causes  moderate compression of the infrahepatic IVC and causes mass-effect upon the gastric antrum, duodenal bulb and descending duodenum.  Other findings: Small bilateral inguinal hernias containing fat. Increased edema and/or inflammation in the mesentery and the retroperitoneum since the prior examination.  Musculoskeletal: Intramedullary nail in the right femur. No evidence of osseous metastatic disease.  Visualized lower thorax: Heart mildly enlarged but stable. Atelectasis/scarring in the right middle lobe and both lower lobes associated with bronchiectasis, unchanged.  IMPRESSION: 1. Bulky lymphadenopathy in the abdomen as detailed above. Since the most recent prior CT 03/05/2015, many of the nodes have become necrotic, likely as a result of radiation therapy. 2. Interval enlargement of previously identified liver masses and multiple new liver masses since the most recent prior CT. 3. Edema/inflammation involving the mesentery and the retroperitoneum, a new finding. This may be the result of radiation therapy or may reflect lymphatic obstruction related to the bulky lymphadenopathy.  4. Enlarging right adrenal metastasis. 5. Small amount of ascites in the pelvis and in the perihepatic space. 6. Stable mild gallbladder wall thickening which is likely related to the chronic hepatocellular disease. No calcified gallstones and no evidence of acute cholecystitis. 7. Stable atelectasis/scarring in the right middle lobe and both lower lobes with bronchiectasis.   Electronically Signed   By: Evangeline Dakin M.D.   On: 04/25/2015 15:53    Kelvin Cellar, MD  [facility] Pager:[phone]  If 7PM-7AM, please contact night-coverage [email or web address] Password [email or web address] 05/12/2015, 1:43 PM   LOS: 3 days

## 2015-05-12 NOTE — Progress Notes (Addendum)
Daily Progress Note   Patient Name: Stuart Moore       Date: 05/12/2015 DOB: 01-31-1956  Age: 59 y.o. MRN#: [id number] Attending Physician: Jonetta Osgood, MD Primary Care Physician: [place] Date: 05/08/2015  Reason for Consultation/Follow-up: Pain control and Psychosocial/spiritual support  Subjective: Pt was doing well late yesterday when I rounded. He was sitting on the side of the bed, conversational. Rated his abd pain as a 4/10. Only mild sedation reported. Cont to say no n/v. Was eating small amounts without change in abd pain or provoking n/v. This am markedly different. Pt crying, moaning, unable to lie flat. He is stating" what's going on; something is different, do something". Wife arrives. Shares that last night he was laughing and reporting that pain was better. Abd more distended. No BS. Placement of stethoscope over abd provoked him crying out in pain. Wife also noted increased abd distention. She also states that he was not in this much pain before admissionLBM 9/17. Increased boluses noted with fentanyl per chart review. He was able to stand with his wife's support to urinate . He did void 100 cc of dark urine Interval Events: Worsening abd pain; increased distention, no BS Length of Stay: 3 days  Current Medications: Scheduled Meds:  . amLODipine  10 mg Oral Daily  . baclofen  10 mg Oral TID  . clonazePAM  0.5 mg Oral BID  . dexamethasone  8 mg Intravenous q morning - 10a  . feeding supplement (ENSURE ENLIVE)  237 mL Oral TID BM  . ferrous fumarate  1 tablet Oral BID  . insulin aspart  0-9 Units Subcutaneous TID WC  . lactulose  20 g Oral BID  . lisinopril  40 mg Oral q morning - 10a  . magnesium hydroxide  30 mL Oral Daily  . pantoprazole  40 mg Oral BID  . pregabalin  50 mg Oral TID  . propranolol  20 mg Oral Daily  . PRUTECT  1 application Topical Daily  . sodium chloride  3 mL Intravenous Q12H    Continuous Infusions: .  fentaNYL infusion INTRAVENOUS 150 mcg/hr (05/11/15 1930)    PRN Meds: sodium chloride, baclofen, bisacodyl, EPINEPHrine, fentaNYL, insulin glargine, LORazepam, ondansetron, sodium chloride, sodium phosphate, sorbitol  Palliative Performance Scale: 40%     Vital Signs: BP 128/78 mmHg  Pulse 81  Temp(Src) 97.9 F (36.6 C) (Oral)  Resp 18  Ht 5\' 7"  (1.702 m)  Wt 72.576 kg (160 lb)  BMI 25.05 kg/m2  SpO2 98% SpO2: SpO2: 98 % O2 Device: O2 Device: Not Delivered O2 Flow Rate:    Intake/output summary:  Intake/Output Summary (Last 24 hours) at 05/12/15 0946 Last data filed at 05/11/15 2010  Gross per 24 hour  Intake    240 ml  Output   1500 ml  Net  -1260 ml   LBM:   Baseline Weight: Weight: 72.576 kg (160 lb) Most recent weight: Weight: 72.576 kg (160 lb)  Physical Exam: General: Older man, in severe pain, crying, doubled over sitting on side of the bed              Additional Data Reviewed: No results for input(s): WBC, HGB, PLT, NA, BUN, CREATININE, ALB in the last 72 hours.   Problem List:  Patient Active Problem List   Diagnosis Date Noted  . Metastatic cancer   . Palliative care encounter   . Hyponatremia   . Other constipation   . Continuous severe  abdominal pain 05/08/2015  . Liver mass, left lobe   . Diabetes mellitus due to underlying condition without complications   . Leukocytosis 07/01/2014  . Fever 06/29/2014  . Hepatocellular carcinoma 06/28/2014  . Renal stone 06/28/2014  . Hydronephrosis 06/28/2014  . Diabetes mellitus 06/28/2014  . Anxiety state 06/28/2014  . Essential hypertension 06/28/2014  . Abdominal pain 06/27/2014  . HEPATITIS C 11/10/2007  . 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 DEPRESSION 11/10/2007  . INTERNAL HEMORRHOIDS 11/10/2007  . GERD 11/10/2007  . HEMOCCULT POSITIVE STOOL 11/10/2007  . SLEEP APNEA 11/10/2007  . HEADACHE, CHRONIC 11/10/2007  . HEART MURMUR, BENIGN 11/10/2007  . FRACTURE, RIGHT LEG 11/10/2007      Palliative Care Assessment & Plan    Code Status:  DNR  Goals of Care:  Comfort. Initial plan was to go home with hospice once pain managed  KUB ordered. Now NPO until results in  Symptom Management:  Pain: Worsening. Get KUB. ? SBO, hopefully he has not perforated. Will change meds back to IV that can be changed. Will increased frequency of prn fentanyl. Cont current dose pending KUB results. Cont decadron. Will stop reglan incase of SBO  Anxiety: Pt very overwhelmed with increased pain. Order to give ativan 1mg  with additional fentanyl 150 mcg now. Change ativan to 0.5-1 IV vs PO. Cont klonopin. May need to change to scheduled ativan  Palliative Prophylaxis:  Continue lactulose 20 gm BID and MOM daily and prn  Psycho-social/Spiritual:  Desire for further Chaplaincy support:no   Prognosis: Pt having a change in clinical condition as evidenced by increased pain, increasd abd distention. KUB Pending to r/o SBO or perforation Discharge Planning: On hold. Original plan was to go home on fentanyl infusion with hospice in the home   Care plan was discussed with Dr. Domingo Cocking with Palliative Medicine and Dr. Coralyn Pear   Thank you for allowing the Palliative Medicine Team to assist in the care of this patient.   Time In: 830 am Time Out: 1000 Total Time 90 Prolonged Time Billed  yes     Greater than 50%  of this time was spent counseling and coordinating care related to the above assessment and plan.   Dory Horn, NP  05/12/2015, 9:46 AM  Please contact Palliative Medicine Team phone at [phone] for questions and concerns.    Addendum: Rounded on pt later in day. Resting more comfortably after increase in fentanyl to 175 mcg/hr and 150 mcg q20 min prn. KUB negative for perforation and obstruction. Restarted clear liquid diet

## 2015-05-12 NOTE — Progress Notes (Signed)
Fentanyl 10 ml wasted from gtt with Nancy Marus.

## 2015-05-13 DIAGNOSIS — G893 Neoplasm related pain (acute) (chronic): Secondary | ICD-10-CM

## 2015-05-13 LAB — GLUCOSE, CAPILLARY
GLUCOSE-CAPILLARY: 212 mg/dL — AB (ref 65–99)
GLUCOSE-CAPILLARY: 201 mg/dL — AB (ref 65–99)
GLUCOSE-CAPILLARY: 192 mg/dL — AB (ref 65–99)
GLUCOSE-CAPILLARY: 226 mg/dL — AB (ref 65–99)
GLUCOSE-CAPILLARY: 200 mg/dL — AB (ref 65–99)
GLUCOSE-CAPILLARY: 216 mg/dL — AB (ref 65–99)
Glucose-Capillary: 297 mg/dL — ABNORMAL HIGH (ref 65–99)
Glucose-Capillary: 376 mg/dL — ABNORMAL HIGH (ref 65–99)
Glucose-Capillary: 168 mg/dL — ABNORMAL HIGH (ref 65–99)
Glucose-Capillary: 210 mg/dL — ABNORMAL HIGH (ref 65–99)
Glucose-Capillary: 155 mg/dL — ABNORMAL HIGH (ref 65–99)
Glucose-Capillary: 148 mg/dL — ABNORMAL HIGH (ref 65–99)

## 2015-05-13 MED ORDER — LORAZEPAM 2 MG/ML IJ SOLN
1.0000 mg | Freq: Four times a day (QID) | INTRAMUSCULAR | Status: DC
  Administered 2015-05-13 – 2015-05-15 (×8): 1 mg via INTRAVENOUS
  Filled 2015-05-13 (×10): qty 1

## 2015-05-13 NOTE — Progress Notes (Signed)
   05/13/15 1500  Clinical Encounter Type  Visited With Family  Visit Type Follow-up  Referral From Palliative care team  Spiritual Encounters  Spiritual Needs Emotional  [facility] responding to Spiritual Consult from [facility]; [place] met at door by family who indicated that Minister just visited who offered prayer that upset family and that was thought to be a prayer of giving up; Family welcomed a more positive prayer at a later time if pt not transferred to hospice.  [place] will follow-up as needed. 3:49 PM Gwynn Burly

## 2015-05-13 NOTE — Progress Notes (Signed)
Daily Progress Note   Patient Name: Stuart Moore       Date: 05/13/2015 DOB: 12-Apr-1956  Age: 59 y.o. MRN#: [id number] Attending Physician: Kelvin Cellar, MD Primary Care Physician: [facility] Admit Date: 05/08/2015  Reason for Consultation/Follow-up: Pain control and Psychosocial/spiritual support, disposition  Subjective:  -patient is minimally responsive to verbal stimuli, wife at bedside, currently on Fentanyl gtt to control pain, has only used 3 breakthrough doses in the past 12 hrs, he does moan when awakened or moved, wife tells me she is not certain that is reaction to real pain or possibly we discussed a self soothing technique  -wife reiterates the importance of getting the patient home.  This will be our goal; place PICC for medication/symptom management,  Continue to adjust medications to enhance comfort, incorporate hospice services on dc    Length of Stay: 4 days  Current Medications: Scheduled Meds:  . amLODipine  10 mg Oral Daily  . baclofen  10 mg Oral TID  . clonazePAM  0.5 mg Oral q morning - 10a  . clonazePAM  1 mg Oral QHS  . dexamethasone  8 mg Intravenous q morning - 10a  . feeding supplement (ENSURE ENLIVE)  237 mL Oral TID BM  . ferrous fumarate  1 tablet Oral BID  . insulin aspart  0-9 Units Subcutaneous TID WC  . lactulose  20 g Oral BID  . lisinopril  40 mg Oral q morning - 10a  . magnesium hydroxide  30 mL Oral Daily  . pantoprazole  40 mg Oral BID  . pregabalin  50 mg Oral TID  . propranolol  20 mg Oral Daily  . PRUTECT  1 application Topical Daily  . sodium chloride  3 mL Intravenous Q12H    Continuous Infusions: . fentaNYL infusion INTRAVENOUS 175 mcg/hr (05/12/15 2312)    PRN Meds: sodium chloride, baclofen, bisacodyl, EPINEPHrine, fentaNYL, insulin glargine, LORazepam, ondansetron, sodium chloride, sodium phosphate, sorbitol  Palliative Performance Scale: 20%     Vital Signs: BP 92/62 mmHg  Pulse 80   Temp(Src) 98 F (36.7 C) (Oral)  Resp 18  Ht 5\' 7"  (1.702 m)  Wt 72.576 kg (160 lb)  BMI 25.05 kg/m2  SpO2 97% SpO2: SpO2: 97 % O2 Device: O2 Device: Not Delivered O2 Flow Rate:    Intake/output summary:   Intake/Output Summary (Last 24 hours) at 05/13/15 0942 Last data filed at 05/13/15 0131  Gross per 24 hour  Intake    223 ml  Output   1150 ml  Net   -927 ml   LBM:   Baseline Weight: Weight: 72.576 kg (160 lb) Most recent weight: Weight: 72.576 kg (160 lb)  Physical Exam: General: minimally responsive, NAD currently HEENT: noted temporal muscle wasting, moist buccal membranes Abd: distended,  CVS: RRR           Additional Data Reviewed: No results for input(s): WBC, HGB, PLT, NA, BUN, CREATININE, ALB in the last 72 hours.   Problem List:  Patient Active Problem List   Diagnosis Date Noted  . Abdominal distension   . Intractable pain   . Metastatic cancer   . Palliative care encounter   . Hyponatremia   . Other constipation   . Continuous severe abdominal pain 05/08/2015  . Liver mass, left lobe   . Diabetes mellitus due to underlying condition without complications   . Leukocytosis 07/01/2014  . Fever 06/29/2014  . Hepatocellular carcinoma 06/28/2014  . Renal stone 06/28/2014  .  Hydronephrosis 06/28/2014  . Diabetes mellitus 06/28/2014  . Anxiety state 06/28/2014  . Essential hypertension 06/28/2014  . Abdominal pain 06/27/2014  . HEPATITIS C 11/10/2007  . 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 DEPRESSION 11/10/2007  . INTERNAL HEMORRHOIDS 11/10/2007  . GERD 11/10/2007  . HEMOCCULT POSITIVE STOOL 11/10/2007  . SLEEP APNEA 11/10/2007  . HEADACHE, CHRONIC 11/10/2007  . HEART MURMUR, BENIGN 11/10/2007  . FRACTURE, RIGHT LEG 11/10/2007     Palliative Care Assessment & Plan    Code Status:  DNR  Goals of Care:   Comfort and dignity, hope is to go home with hospice once pain managed and PICC line placed  Family is hopeful for a home  death   Symptom Management:  Pain: Place PICC                  Continue Fentanyl gtt and utilize boluses to enhance comfort,  if need for boluses increase will adjust                basal rate        Anxiety:  Ativan 1 mg  IV every 6 hrs scheduled  Discussed with wife expectations and natural trajectory at EOL as it relates to decreased conscientiousness, and role of medications for symptom managment    Psycho-social/Spiritual:  Emotional support offered, provided space for wife to share a life review of their marriage and relationship   Prognosis:  < than a few weeks, discussed possibility of hospice facility but family wants to take him home   Care plan was discussed with  Dr. Coralyn Pear   Thank you for allowing the Palliative Medicine Team to assist in the care of this patient.   Time In: 0915 am Time Out: 1000 Total Time 45 min Prolonged Time Billed  no     Greater than 50%  of this time was spent counseling and coordinating care related to the above assessment and plan.   Knox Royalty, NP  05/13/2015, 9:42 AM  Please contact Palliative Medicine Team phone at [phone] for questions and concerns.

## 2015-05-13 NOTE — Progress Notes (Signed)
Follow-up to referral for home with hospice services with [facility].  Spoke with wife at bedside.  Plan is to discharge home with [facility] services tomorrow.  Per discussion, patient will need to be transported via [place].  Per discussion, patient will be getting a PICC placed today and go home on a Fentanyl CADD pump for pain control.  Wife verbalized their ultimate goal is to bring him home for EOL care.  Please send signed completed DNR form home with patient.  Patient will need prescriptions for discharge comfort medications.   Additional DME ordered per request.  A hospital bed has been ordered for delivery today.  [facility] referral center aware of the above.  Completed discharge summary will need to be faxed to [facility] at [phone] when final.  Please notify [facility] when patient is ready to leave unit at discharge-call [phone].   [facility] information and contact numbers have been given to [place] during visit.   [facility] will continue to follow.  Please call with any questions.  Annia Belt RN, [facility] Liaison  [phone]

## 2015-05-13 NOTE — Care Management Important Message (Signed)
Important Message  Patient Details  Name: SAMAJ WESSELLS MRN: [id number] Date of Birth: 09/30/1955   Medicare Important Message Given:  Yes-second notification given    Camillo Flaming 05/13/2015, 10:51 AMImportant Message  Patient Details  Name: AMAHD MORINO MRN: [id number] Date of Birth: 1956/04/26   Medicare Important Message Given:  Yes-second notification given    Camillo Flaming 05/13/2015, 10:50 AM

## 2015-05-13 NOTE — Progress Notes (Signed)
PATIENT DETAILS Name: Stuart Moore Age: 59 y.o. Sex: male Date of Birth: 1955-09-26 Admit Date: 05/08/2015 Admitting Physician Phillips Grout, MD [facility]  Brief narrative:  59 year old male with metastatic hepatocellular carcinoma admitted with severe abdominal pain. Palliative care consulted, pain regimen being a chested. Plans are to discharge home with hospice when pain more adequately controlled.  Subjective: Patient's complaining of severe abdominal pain today, states "not feeling right" he denies fevers, chills, nausea, vomiting. No infusion is currently running and has 150 mg IV every half hour for breakthrough pain.  Assessment/Plan: Principal Problem: Intractable abdominal pain:  -Secondary to progressive stage IV hepatocellular carcinoma. -On today's evaluation it appeared his pain symptoms had worsened possibly having increased abdominal distention. This was further worked up with a KUB which revealed nonobstructive gas pattern, abdominal perforation not obvious on study. He remains afebrile and hemodynamically stable. I discussed case with palliative care, discharge will need to be held as patient's pain symptoms still not under control. He seemed to do better with an increase in his IV Ativan dose. -After increasing his fentanyl dose to 175 mcg pain symptoms appear to be better controlled overnight. -I met with his wife to discuss plan of care. She expresses wishes to take him home with home hospice.   Active Problems:  Essential hypertension:  -Controlled-continue with lisinopril, propranolol and amlodipine  Hiccups-persistent:  -Controlled with baclofen  Metastatic Hepatocellular carcinoma:  -Patient with advanced/end stage disease, goals of care discussed with patient during this hospitalization as he his wishes are to go home with hospice services. Care was consulted to assist with pain management -Palliative care  following, he is on fentanyl IV  Disposition: Anticipate discharge in the next 24 hours if remains stable.   Antimicrobial agents  See below  Anti-infectives    None      DVT Prophylaxis: SCD's  Code Status: DNR  Family Communication None at bedside  Procedures: None  CONSULTS:  Palliative care medicine  Time spent 20 minutes  MEDICATIONS: Scheduled Meds: . dexamethasone  8 mg Intravenous q morning - 10a  . feeding supplement (ENSURE ENLIVE)  237 mL Oral TID BM  . ferrous fumarate  1 tablet Oral BID  . insulin aspart  0-9 Units Subcutaneous TID WC  . LORazepam  1 mg Intravenous Q6H  . magnesium hydroxide  30 mL Oral Daily  . PRUTECT  1 application Topical Daily  . sodium chloride  3 mL Intravenous Q12H   Continuous Infusions: . fentaNYL infusion INTRAVENOUS 175 mcg/hr (05/13/15 1239)   PRN Meds:.sodium chloride, baclofen, bisacodyl, EPINEPHrine, fentaNYL, insulin glargine, LORazepam, ondansetron, sodium chloride, sodium phosphate, sorbitol    PHYSICAL EXAM: Vital signs in last 24 hours: Filed Vitals:   05/12/15 2023 05/13/15 0523 05/13/15 1000 05/13/15 1322  BP: 101/71 92/62 91/56  103/69  Pulse: 74 80 84 85  Temp: 98 F (36.7 C)   97.8 F (36.6 C)  TempSrc: Oral Oral  Oral  Resp: 18 18 14 14   Height:      Weight:      SpO2: 97% 97% 94% 96%    Weight change:  Filed Weights   05/08/15 2332  Weight: 72.576 kg (160 lb)   Body mass index is 25.05 kg/(m^2).   Gen Exam: Patient seems more comfortable compared to yesterday's exam.  Neck: Supple, No JVD.   Chest: B/L Clear.   CVS: S1 S2 Regular,  no murmurs.  Abdomen: Abdominal pain is less severe compared to yesterday's exam.  Extremities: no edema, lower extremities warm to touch. Neurologic: Non Focal.   Skin: No Rash.   Wounds: N/A.    Intake/Output from previous day:  Intake/Output Summary (Last 24 hours) at 05/13/15 1545 Last data filed at 05/13/15 0131  Gross per 24 hour  Intake     150 ml  Output    550 ml  Net   -400 ml     LAB RESULTS: CBC  Recent Labs Lab 05/08/15 1638  WBC 16.3*  HGB 11.9*  HCT 36.1*  PLT 485*  MCV 82.0  MCH 27.0  MCHC 33.0  RDW 17.8*  LYMPHSABS 0.8  MONOABS 2.0*  EOSABS 0.0  BASOSABS 0.0    Chemistries   Recent Labs Lab 05/08/15 1638 05/08/15 1815  NA 126* 129*  K SPECIMEN HEMOLYZED. HEMOLYSIS MAY AFFECT INTEGRITY OF RESULTS. 4.0  CL 84* 93*  CO2 32 29  GLUCOSE 262* 208*  BUN 8 7  CREATININE 0.87 0.58*  CALCIUM 8.7* 7.2*    CBG:  Recent Labs Lab 05/09/15 1743 05/09/15 2121 05/10/15 0724 05/10/15 1200 05/10/15 1651  GLUCAP 287* 268* 178* 230* 280*    GFR Estimated Creatinine Clearance: 91.8 mL/min (by C-G formula based on Cr of 0.58).  Coagulation profile No results for input(s): INR, PROTIME in the last 168 hours.  Cardiac Enzymes No results for input(s): CKMB, TROPONINI, MYOGLOBIN in the last 168 hours.  Invalid input(s): CK  Invalid input(s): POCBNP No results for input(s): DDIMER in the last 72 hours. No results for input(s): HGBA1C in the last 72 hours. No results for input(s): CHOL, HDL, LDLCALC, TRIG, CHOLHDL, LDLDIRECT in the last 72 hours. No results for input(s): TSH, T4TOTAL, T3FREE, THYROIDAB in the last 72 hours.  Invalid input(s): FREET3 No results for input(s): VITAMINB12, FOLATE, FERRITIN, TIBC, IRON, RETICCTPCT in the last 72 hours. No results for input(s): LIPASE, AMYLASE in the last 72 hours.  Urine Studies No results for input(s): UHGB, CRYS in the last 72 hours.  Invalid input(s): UACOL, UAPR, USPG, UPH, UTP, UGL, UKET, UBIL, UNIT, UROB, ULEU, UEPI, UWBC, URBC, UBAC, CAST, UCOM, BILUA  MICROBIOLOGY: No results found for this or any previous visit (from the past 240 hour(s)).  RADIOLOGY STUDIES/RESULTS: Dg Chest 2 View  04/25/2015   CLINICAL DATA:  Fever. Progressive abdominal pain and diarrhea for 2 days. History of liver cancer, status post radiation therapy.  EXAM:  CHEST  2 VIEW  COMPARISON:  02/12/2015  FINDINGS: Numerous leads and wires project over the chest. Mild right hemidiaphragm elevation. Midline trachea. Remote left rib trauma. Mild cardiomegaly. No pleural effusion or pneumothorax. Bibasilar volume loss with atelectasis. No lobar consolidation. Small bowel air-fluid levels are nonspecific.  IMPRESSION: Bibasilar volume loss with subsegmental atelectasis. No acute cardiopulmonary disease.  Nonspecific small bowel fluid levels.   Electronically Signed   By: Abigail Miyamoto M.D.   On: 04/25/2015 13:52   Dg Abd 1 View  05/12/2015   CLINICAL DATA:  Abdominal distension and pain  EXAM: ABDOMEN - 1 VIEW  COMPARISON:  04/25/2015  FINDINGS: Primarily gasless abdomen with small amount of air throughout colon. Diffuse increased attenuation over the abdomen with bulging flanks.  IMPRESSION: Nonobstructive gas pattern.  Possibility of ascites not excluded.   Electronically Signed   By: Skipper Cliche M.D.   On: 05/12/2015 10:30   Ct Abdomen Pelvis W Contrast  04/25/2015   CLINICAL DATA:  59 year old with recent  completion of palliative radiation therapy for metastatic hepatocellular carcinoma, presenting with fever, steadily worsening generalized abdominal pain and diarrhea over the past 3 days.  EXAM: CT ABDOMEN AND PELVIS WITH CONTRAST  TECHNIQUE: Multidetector CT imaging of the abdomen and pelvis was performed using the standard protocol following bolus administration of intravenous contrast.  CONTRAST:  137m OMNIPAQUE IOHEXOL 300 MG/ML IV. Oral contrast was also administered.  COMPARISON:  03/05/2015 and earlier.  FINDINGS: Hepatobiliary: Hepatic cirrhosis with relative enlargement of the left lobe and caudate lobe as noted previously. Interval enlargement of previously identified multiple liver masses, as well as multiple new masses when compared to the most recent previous CT 03/05/2015, the largest mass in the lateral segment left lobe measuring approximately 2.6 x  3.4 cm (less than 1 cm on the prior CT). Patent portal vein. Mild wall thickening and enhancement of the mucosa of the gallbladder. No calcified gallstones. No pericholecystic inflammation.  Spleen:  Normal in size and appearance.  Pancreas:  Normal in appearance.  No pancreatic ductal dilation.  Adrenal glands: Normal left adrenal gland. Approximate 1.6 x 2.7 cm mass involving the right adrenal gland, increased in size since the prior CT.  Genitourinary: Nonobstructing approximate 2-3 mm calculus in an upper pole calix of the right kidney. No urinary tract calculi elsewhere on either side. No evidence of urinary tract obstruction. 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 Normal-appearing urinary bladder.  Prostate gland and seminal vesicles normal in size and appearance for age.  Gastrointestinal: Varices involving the distal esophagus. No intrinsic abnormality involving the stomach. Mild wall thickening involving the entire duodenum. Small bowel otherwise normal in appearance. Normal-appearing colon with moderate stool burden.  Ascites: Small amount of ascites dependently in the pelvis and in the perihepatic space.  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  Lymphatic: Bulky lymphadenopathy in the gastrohepatic ligament, hepatoduodenal ligament, porta hepatis, celiac axis and retroperitoneum of the upper abdomen with evidence of necrosis, likely related to the recent radiation therapy. Enlarged lymph nodes at the root of the mesentery have increased in size, and there is enlargement of left periaortic retroperitoneal lymph nodes. The lymphadenopathy causes moderate compression of the infrahepatic IVC and causes mass-effect upon the gastric antrum, duodenal bulb and descending duodenum.  Other findings: Small bilateral inguinal hernias containing fat. Increased edema and/or inflammation in the mesentery and the retroperitoneum since the prior examination.   Musculoskeletal: Intramedullary nail in the right femur. No evidence of osseous metastatic disease.  Visualized lower thorax: Heart mildly enlarged but stable. Atelectasis/scarring in the right middle lobe and both lower lobes associated with bronchiectasis, unchanged.  IMPRESSION: 1. Bulky lymphadenopathy in the abdomen as detailed above. Since the most recent prior CT 03/05/2015, many of the nodes have become necrotic, likely as a result of radiation therapy. 2. Interval enlargement of previously identified liver masses and multiple new liver masses since the most recent prior CT. 3. Edema/inflammation involving the mesentery and the retroperitoneum, a new finding. This may be the result of radiation therapy or may reflect lymphatic obstruction related to the bulky lymphadenopathy. 4. Enlarging right adrenal metastasis. 5. Small amount of ascites in the pelvis and in the perihepatic space. 6. Stable mild gallbladder wall thickening which is likely related to the chronic hepatocellular disease. No calcified gallstones and no evidence of acute cholecystitis. 7. Stable atelectasis/scarring in the right middle lobe and both lower lobes with bronchiectasis.   Electronically Signed   By: TEvangeline DakinM.D.   On:  04/25/2015 15:53    Kelvin Cellar, MD  [facility] Pager:[phone]  If 7PM-7AM, please contact night-coverage [email or web address] Password [email or web address] 05/13/2015, 3:45 PM   LOS: 4 days

## 2015-05-14 DIAGNOSIS — F4323 Adjustment disorder with mixed anxiety and depressed mood: Secondary | ICD-10-CM | POA: Insufficient documentation

## 2015-05-14 LAB — GLUCOSE, CAPILLARY
GLUCOSE-CAPILLARY: 125 mg/dL — AB (ref 65–99)
GLUCOSE-CAPILLARY: 137 mg/dL — AB (ref 65–99)
Glucose-Capillary: 152 mg/dL — ABNORMAL HIGH (ref 65–99)
Glucose-Capillary: 170 mg/dL — ABNORMAL HIGH (ref 65–99)

## 2015-05-14 MED ORDER — SODIUM CHLORIDE 0.9 % IJ SOLN
10.0000 mL | Freq: Two times a day (BID) | INTRAMUSCULAR | Status: DC
  Administered 2015-05-14: 10 mL

## 2015-05-14 MED ORDER — SODIUM CHLORIDE 0.9 % IJ SOLN: 10.0000 mL | INTRAMUSCULAR | Status: DC | PRN

## 2015-05-14 MED ORDER — FENTANYL BOLUS VIA INFUSION
150.0000 ug | INTRAVENOUS | Status: DC | PRN
  Administered 2015-05-14 – 2015-05-16 (×16): 150 ug via INTRAVENOUS
  Filled 2015-05-14: qty 150

## 2015-05-14 NOTE — Progress Notes (Signed)
Peripherally Inserted Central Catheter/Midline Placement  The IV Nurse has discussed with the patient and/or persons authorized to consent for the patient, the purpose of this procedure and the potential benefits and risks involved with this procedure.  The benefits include less needle sticks, lab draws from the catheter and patient may be discharged home with the catheter.  Risks include, but not limited to, infection, bleeding, blood clot (thrombus formation), and puncture of an artery; nerve damage and irregular heat beat.  Alternatives to this procedure were also discussed.  PICC/Midline Placement Documentation  PICC / Midline Single Lumen 05/14/15 PICC Left Brachial 36 cm 2 cm (Active)  Indication for Insertion or Continuance of Line Home intravenous therapies (PICC only) 05/14/2015  9:00 AM  Exposed Catheter (cm) 2 cm 05/14/2015  9:00 AM  Dressing Change Due 05/21/15 05/14/2015  9:00 AM       Stuart Moore 05/14/2015, 9:25 AM

## 2015-05-14 NOTE — Progress Notes (Signed)
PATIENT DETAILS Name: Stuart Moore Age: 59 y.o. Sex: male Date of Birth: 02/06/1956 Admit Date: 05/08/2015 Admitting Physician Phillips Grout, MD [id number]  Brief narrative:  59 year old male with metastatic hepatocellular carcinoma admitted with severe abdominal pain. Palliative care consulted, currently working on titrating fentanyl IV. Plans are to discharge home with hospice when pain more adequately controlled.  Subjective: Patient was resting fairly this morning however overnight required 10 boluses of IV fentanyl  Assessment/Plan: Principal Problem: Intractable abdominal pain:  -Secondary to progressive stage IV hepatocellular carcinoma. -On today's evaluation it appeared his pain symptoms had worsened possibly having increased abdominal distention. This was further worked up with a KUB which revealed nonobstructive gas pattern, abdominal perforation not obvious on study. He remains afebrile and hemodynamically stable. I discussed case with palliative care, discharge will need to be held as patient's pain symptoms still not under control. He seemed to do better with an increase in his IV Ativan dose. -Despite increasing his fentanyl dose to 175 mcg he required 2 boluses of IV fentanyl overnight for breakthrough pain. It appears his pain is still suboptimally controlled -I met with his wife to discuss plan of care. -His fentanyl today was increased to 300 mcg/h (previously on 175 mcg her hour) while maintaining 150 mcg every hour as needed for severe breakthrough pain.   Active Problems:  Essential hypertension:  -Controlled-continue with lisinopril, propranolol and amlodipine  Hiccups-persistent:  -Controlled with baclofen  Metastatic Hepatocellular carcinoma:  -Patient with advanced/end stage disease, goals of care discussed with patient during this hospitalization as he his wishes are to go home with hospice services. Care was  consulted to assist with pain management -Palliative care following, he is on fentanyl IV  Disposition: Anticipate discharge to his home with home hospice services following in the next 24 hours if he remains stable  and his pain is under control   Antimicrobial agents  See below  Anti-infectives    None      DVT Prophylaxis: SCD's  Code Status: DNR  Family Communication None at bedside  Procedures: None  CONSULTS:  Palliative care medicine  Time spent 20 minutes  MEDICATIONS: Scheduled Meds: . dexamethasone  8 mg Intravenous q morning - 10a  . feeding supplement (ENSURE ENLIVE)  237 mL Oral TID BM  . ferrous fumarate  1 tablet Oral BID  . insulin aspart  0-9 Units Subcutaneous TID WC  . LORazepam  1 mg Intravenous Q6H  . magnesium hydroxide  30 mL Oral Daily  . PRUTECT  1 application Topical Daily  . sodium chloride  10-40 mL Intracatheter Q12H  . sodium chloride  3 mL Intravenous Q12H   Continuous Infusions: . fentaNYL infusion INTRAVENOUS 300 mcg/hr (05/14/15 1238)   PRN Meds:.sodium chloride, baclofen, bisacodyl, EPINEPHrine, fentaNYL, insulin glargine, LORazepam, ondansetron, sodium chloride, sodium chloride, sodium phosphate, sorbitol    PHYSICAL EXAM: Vital signs in last 24 hours: Filed Vitals:   05/13/15 1322 05/13/15 2126 05/14/15 0658 05/14/15 1413  BP: 1_0 102/64  Pulse: 85 91 82 83  Temp: 97.8 F (36.6 C) 98.1 F (36.7 C) 97.4 F (36.3 C) 98.3 F (36.8 C)  TempSrc: Oral Oral Oral Oral  Resp: _1 Height:      Weight:      SpO2: 96% 98% 98% 94%    Weight change:  Filed Weights   05/08/15 2332  Weight: 72.576 kg (160 lb)   Body mass index is 25.05 kg/(m^2).   Gen Exam: Patient is resting comfortably, no acute distress  Neck: Supple, No JVD.   Chest: B/L Clear.   CVS: S1 S2 Regular, no murmurs.  Abdomen: Abdominal pain is less severe compared to yesterday's exam.  Extremities: no edema, lower extremities  warm to touch. Neurologic: Non Focal.   Skin: No Rash.   Wounds: N/A.    Intake/Output from previous day:  Intake/Output Summary (Last 24 hours) at 05/14/15 1745 Last data filed at 05/14/15 1416  Gross per 24 hour  Intake      0 ml  Output    800 ml  Net   -800 ml     LAB RESULTS: CBC  Recent Labs Lab 05/08/15 1638  WBC 16.3*  HGB 11.9*  HCT 36.1*  PLT 485*  MCV 82.0  MCH 27.0  MCHC 33.0  RDW 17.8*  LYMPHSABS 0.8  MONOABS 2.0*  EOSABS 0.0  BASOSABS 0.0    Chemistries   Recent Labs Lab 05/08/15 1638 05/08/15 1815  NA 126* 129*  K SPECIMEN HEMOLYZED. HEMOLYSIS MAY AFFECT INTEGRITY OF RESULTS. 4.0  CL 84* 93*  CO2 32 29  GLUCOSE 262* 208*  BUN 8 7  CREATININE 0.87 0.58*  CALCIUM 8.7* 7.2*    CBG:  Recent Labs Lab 05/13/15 1209 05/13/15 1652 05/13/15 2111 05/14/15 0803 05/14/15 1212  GLUCAP 148* 200* 216* 125* 152*    GFR Estimated Creatinine Clearance: 91.8 mL/min (by C-G formula based on Cr of 0.58).  Coagulation profile No results for input(s): INR, PROTIME in the last 168 hours.  Cardiac Enzymes No results for input(s): CKMB, TROPONINI, MYOGLOBIN in the last 168 hours.  Invalid input(s): CK  Invalid input(s): POCBNP No results for input(s): DDIMER in the last 72 hours. No results for input(s): HGBA1C in the last 72 hours. No results for input(s): CHOL, HDL, LDLCALC, TRIG, CHOLHDL, LDLDIRECT in the last 72 hours. No results for input(s): TSH, T4TOTAL, T3FREE, THYROIDAB in the last 72 hours.  Invalid input(s): FREET3 No results for input(s): VITAMINB12, FOLATE, FERRITIN, TIBC, IRON, RETICCTPCT in the last 72 hours. No results for input(s): LIPASE, AMYLASE in the last 72 hours.  Urine Studies No results for input(s): UHGB, CRYS in the last 72 hours.  Invalid input(s): UACOL, UAPR, USPG, UPH, UTP, UGL, UKET, UBIL, UNIT, UROB, ULEU, UEPI, UWBC, URBC, UBAC, CAST, UCOM, BILUA  MICROBIOLOGY: No results found for this or any previous  visit (from the past 240 hour(s)).  RADIOLOGY STUDIES/RESULTS: Dg Chest 2 View  04/25/2015   CLINICAL DATA:  Fever. Progressive abdominal pain and diarrhea for 2 days. History of liver cancer, status post radiation therapy.  EXAM: CHEST  2 VIEW  COMPARISON:  02/12/2015  FINDINGS: Numerous leads and wires project over the chest. Mild right hemidiaphragm elevation. Midline trachea. Remote left rib trauma. Mild cardiomegaly. No pleural effusion or pneumothorax. Bibasilar volume loss with atelectasis. No lobar consolidation. Small bowel air-fluid levels are nonspecific.  IMPRESSION: Bibasilar volume loss with subsegmental atelectasis. No acute cardiopulmonary disease.  Nonspecific small bowel fluid levels.   Electronically Signed   By: Abigail Miyamoto M.D.   On: 04/25/2015 13:52   Dg Abd 1 View  05/12/2015   CLINICAL DATA:  Abdominal distension and pain  EXAM: ABDOMEN - 1 VIEW  COMPARISON:  04/25/2015  FINDINGS: Primarily gasless abdomen with small amount of air throughout colon. Diffuse increased attenuation over the abdomen with bulging flanks.  IMPRESSION:  Nonobstructive gas pattern.  Possibility of ascites not excluded.   Electronically Signed   By: Skipper Cliche M.D.   On: 05/12/2015 10:30   Ct Abdomen Pelvis W Contrast  04/25/2015   CLINICAL DATA:  59 year old with recent completion of palliative radiation therapy for metastatic hepatocellular carcinoma, presenting with fever, steadily worsening generalized abdominal pain and diarrhea over the past 3 days.  EXAM: CT ABDOMEN AND PELVIS WITH CONTRAST  TECHNIQUE: Multidetector CT imaging of the abdomen and pelvis was performed using the standard protocol following bolus administration of intravenous contrast.  CONTRAST:  181m OMNIPAQUE IOHEXOL 300 MG/ML IV. Oral contrast was also administered.  COMPARISON:  03/05/2015 and earlier.  FINDINGS: Hepatobiliary: Hepatic cirrhosis with relative enlargement of the left lobe and caudate lobe as noted previously.  Interval enlargement of previously identified multiple liver masses, as well as multiple new masses when compared to the most recent previous CT 03/05/2015, the largest mass in the lateral segment left lobe measuring approximately 2.6 x 3.4 cm (less than 1 cm on the prior CT). Patent portal vein. Mild wall thickening and enhancement of the mucosa of the gallbladder. No calcified gallstones. No pericholecystic inflammation.  Spleen:  Normal in size and appearance.  Pancreas:  Normal in appearance.  No pancreatic ductal dilation.  Adrenal glands: Normal left adrenal gland. Approximate 1.6 x 2.7 cm mass involving the right adrenal gland, increased in size since the prior CT.  Genitourinary: Nonobstructing approximate 2-3 mm calculus in an upper pole calix of the right kidney. No urinary tract calculi elsewhere on either side. No evidence of urinary tract obstruction. 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 Normal-appearing urinary bladder.  Prostate gland and seminal vesicles normal in size and appearance for age.  Gastrointestinal: Varices involving the distal esophagus. No intrinsic abnormality involving the stomach. Mild wall thickening involving the entire duodenum. Small bowel otherwise normal in appearance. Normal-appearing colon with moderate stool burden.  Ascites: Small amount of ascites dependently in the pelvis and in the perihepatic space.  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  Lymphatic: Bulky lymphadenopathy in the gastrohepatic ligament, hepatoduodenal ligament, porta hepatis, celiac axis and retroperitoneum of the upper abdomen with evidence of necrosis, likely related to the recent radiation therapy. Enlarged lymph nodes at the root of the mesentery have increased in size, and there is enlargement of left periaortic retroperitoneal lymph nodes. The lymphadenopathy causes moderate compression of the infrahepatic IVC and causes mass-effect  upon the gastric antrum, duodenal bulb and descending duodenum.  Other findings: Small bilateral inguinal hernias containing fat. Increased edema and/or inflammation in the mesentery and the retroperitoneum since the prior examination.  Musculoskeletal: Intramedullary nail in the right femur. No evidence of osseous metastatic disease.  Visualized lower thorax: Heart mildly enlarged but stable. Atelectasis/scarring in the right middle lobe and both lower lobes associated with bronchiectasis, unchanged.  IMPRESSION: 1. Bulky lymphadenopathy in the abdomen as detailed above. Since the most recent prior CT 03/05/2015, many of the nodes have become necrotic, likely as a result of radiation therapy. 2. Interval enlargement of previously identified liver masses and multiple new liver masses since the most recent prior CT. 3. Edema/inflammation involving the mesentery and the retroperitoneum, a new finding. This may be the result of radiation therapy or may reflect lymphatic obstruction related to the bulky lymphadenopathy. 4. Enlarging right adrenal metastasis. 5. Small amount of ascites in the pelvis and in the perihepatic space. 6. Stable mild gallbladder wall thickening which is likely related  to the chronic hepatocellular disease. No calcified gallstones and no evidence of acute cholecystitis. 7. Stable atelectasis/scarring in the right middle lobe and both lower lobes with bronchiectasis.   Electronically Signed   By: Evangeline Dakin M.D.   On: 04/25/2015 15:53    Kelvin Cellar, MD  [facility] Pager:[phone]  If 7PM-7AM, please contact night-coverage [email or web address] Password [email or web address] 05/14/2015, 5:45 PM   LOS: 5 days

## 2015-05-14 NOTE — Progress Notes (Signed)
   05/14/15 1100  Clinical Encounter Type  Visited With Family;Patient and family together  Visit Type Follow-up;Psychological support;Spiritual support  Referral From Nurse  Consult/Referral To Chaplain  Spiritual Encounters  Spiritual Needs [facility] text;Literature;Emotional;Other (Comment);Grief support Academic librarian )  Stress Factors  Patient Stress Factors Health changes;Major life changes  Family Stress Factors Major life changes;Other (Comment) (Patient's Health)   The Chaplain visited with the patient for a follow up, but the patient was asleep upon arrival. His son was at the bedside. The visit consisted of ministry for the son and the two brother who arrived later in the visit.  The son stated that the family had been trying to stay strong to support the patient. The son is still working through recent grief over the loss of his grandmother who passed away of cancer. His main concern was over his father's pain and controlling that so that his father(pt.) does not suffer. The son understood the plan of care, and agreed with what the treatment team have been doing for the patient. He understood that his father would be discharged once his pain levels were under control.  The son feels that the family has been doing a good job of supporting one another. The patient's daughter is on her way from [place] to visit with the patient.  The son requested materials that would help to spiritually uplift him and keep him grounded. The son had been doing research on his own, finding prayers that he has shared with the patient.  The Chaplain offered emotional support, prayer shawl, literature and pastoral conversation.  The Chaplain will continue to follow up with the patient and his family while he is at [employer].

## 2015-05-14 NOTE — Progress Notes (Signed)
Daily Progress Note   Patient Name: Stuart Moore       Date: 05/14/2015 DOB: [birth year]  Age: 59 y.o. MRN#: [id number] Attending Physician: Kelvin Cellar, MD Primary Care Physician: [facility] Admit Date: 05/08/2015  Reason for Consultation/Follow-up: Pain control and Psychosocial/spiritual support, disposition  Subjective:  -patient is minimally responsive to verbal stimuli, wife at bedside, currently on Fentanyl gtt to control pain,has required 10  breakthrough doses in the past 12 hrs, per wife pain is centered in his thoracic back area  -wife reiterates the importance of getting the patient home.  This will be our goal; IV team at bedside to place PICC for medication/symptom management,  Continue to adjust medications to enhance comfort, incorporate hospice services on dc    Length of Stay: 5 days  Current Medications: Scheduled Meds:  . dexamethasone  8 mg Intravenous q morning - 10a  . feeding supplement (ENSURE ENLIVE)  237 mL Oral TID BM  . ferrous fumarate  1 tablet Oral BID  . insulin aspart  0-9 Units Subcutaneous TID WC  . LORazepam  1 mg Intravenous Q6H  . magnesium hydroxide  30 mL Oral Daily  . PRUTECT  1 application Topical Daily  . sodium chloride  3 mL Intravenous Q12H    Continuous Infusions: . fentaNYL infusion INTRAVENOUS 175 mcg/hr (05/13/15 2247)    PRN Meds: sodium chloride, baclofen, bisacodyl, EPINEPHrine, fentaNYL, insulin glargine, LORazepam, ondansetron, sodium chloride, sodium phosphate, sorbitol  Palliative Performance Scale: 20%     Vital Signs: BP 98/68 mmHg  Pulse 82  Temp(Src) 97.4 F (36.3 C) (Oral)  Resp 12  Ht 5\' 7"  (1.702 m)  Wt 72.576 kg (160 lb)  BMI 25.05 kg/m2  SpO2 98% SpO2: SpO2: 98 % O2 Device: O2 Device: Not Delivered O2 Flow Rate:    Intake/output summary:   Intake/Output Summary (Last 24 hours) at 05/14/15 0811 Last data filed at 05/13/15 1912  Gross per 24 hour  Intake      0 ml    Output    400 ml  Net   -400 ml   LBM:   Baseline Weight: Weight: 72.576 kg (160 lb) Most recent weight: Weight: 72.576 kg (160 lb)  Physical Exam: General: minimally responsive, NAD currently HEENT: noted temporal muscle wasting, moist buccal membranes Abd: distended,  CVS: RRR           Additional Data Reviewed: No results for input(s): WBC, HGB, PLT, NA, BUN, CREATININE, ALB in the last 72 hours.   Problem List:  Patient Active Problem List   Diagnosis Date Noted  . Cancer related pain 05/13/2015  . Abdominal distension   . Intractable pain   . Metastatic cancer   . Palliative care encounter   . Hyponatremia   . Other constipation   . Continuous severe abdominal pain 05/08/2015  . Liver mass, left lobe   . Diabetes mellitus due to underlying condition without complications   . Leukocytosis 07/01/2014  . Fever 06/29/2014  . Hepatocellular carcinoma 06/28/2014  . Renal stone 06/28/2014  . Hydronephrosis 06/28/2014  . Diabetes mellitus 06/28/2014  . Anxiety state 06/28/2014  . Essential hypertension 06/28/2014  . Abdominal pain 06/27/2014  . HEPATITIS C 11/10/2007  . 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 DEPRESSION 11/10/2007  . INTERNAL HEMORRHOIDS 11/10/2007  . GERD 11/10/2007  . HEMOCCULT POSITIVE STOOL 11/10/2007  . SLEEP APNEA 11/10/2007  . HEADACHE, CHRONIC 11/10/2007  . HEART MURMUR, BENIGN 11/10/2007  . FRACTURE, RIGHT LEG  11/10/2007     Palliative Care Assessment & Plan    Code Status:  DNR  Goals of Care:   Comfort and dignity, hope is to go home with hospice once pain managed and PICC line placed  Family is hopeful for a home death   Symptom Management:  Pain: Place PICC                  Increase  Fentanyl gtt/300 mcg/hr and utilize boluses to enhance comfort,                                  Anxiety:  Ativan 1 mg  IV every 6 hrs scheduled and every 4 prn  Discussed with wife expectations and natural trajectory at EOL as it  relates to decreased conscientiousness, and role of medications for symptom managment    Psycho-social/Spiritual:  Emotional support offered, chaplain services involved   Prognosis:  < than a few weeks, again  discussed possibility of hospice facility but family wants to take him home.  Re evaluate in the morning for possible dc home   Care plan was discussed with  Dr. Coralyn Pear   Thank you for allowing the Palliative Medicine Team to assist in the care of this patient.   Time In: 0745 am Time Out: 0830 Total Time 45 min Prolonged Time Billed  no     Greater than 50%  of this time was spent counseling and coordinating care related to the above assessment and plan.   Knox Royalty, NP  05/14/2015, 8:11 AM  Please contact Palliative Medicine Team phone at [phone] for questions and concerns.

## 2015-05-15 ENCOUNTER — Encounter: Payer: Self-pay | Admitting: *Deleted

## 2015-05-15 NOTE — Progress Notes (Signed)
Nutrition Brief Note  Pt identified as having a low Braden score.  Chart reviewed. Pt's GOC: comfort care.  No nutrition interventions warranted at this time.  Please consult as needed.   Clayton Bibles, MS, RD, LDN Pager: [phone] After Hours Pager: [phone]

## 2015-05-15 NOTE — Progress Notes (Signed)
Received call from [place], Hospice/Palliative care in [place] stating pt decided to go to [facility] and wanted to know if Dr. Benay Spice would be attending; Dr. Benay Spice in agreement and Marzetta Board made aware.

## 2015-05-15 NOTE — Progress Notes (Signed)
PATIENT DETAILS Name: Stuart Moore Age: 59 y.o. Sex: male Date of Birth: 09/28/1955 Admit Date: 05/08/2015 Admitting Physician Phillips Grout, MD P[facility]  Brief narrative:  59 year old male with metastatic hepatocellular carcinoma admitted with severe abdominal pain. Palliative care consulted, currently working on titrating fentanyl IV. Plans are to discharge to residential hospice  Subjective: Patient has required several boluses of IV fentanyl per my discussion with nursing. We discussed whether or not patient would be able to have a pain medication regimen as intensive as in the hospital at home. After discussion with family they have decided to take patient to residential hospice.  Assessment/Plan: Principal Problem: Intractable abdominal pain:  -Secondary to progressive stage IV hepatocellular carcinoma. -On today's evaluation it appeared his pain symptoms had worsened possibly having increased abdominal distention. This was further worked up with a KUB which revealed nonobstructive gas pattern, abdominal perforation not obvious on study. He remains afebrile and hemodynamically stable. I discussed case with palliative care - Palliative/hospice assisting with symptom management  Active Problems:  Essential hypertension:  -Patient was soft blood pressures and currently antihypertensives on hold  Hiccups-persistent:  -Controlled with baclofen  Metastatic Hepatocellular carcinoma:  -Patient with advanced/end stage disease, goals of care discussed with patient during this hospitalization as he his wishes are to go home with hospice services. Care was consulted to assist with pain management -Palliative care following, he is on fentanyl IV  Disposition: Anticipate discharge residential hospice once room available. Discussed with case manager, nursing, and family  Antimicrobial agents  See below  Anti-infectives    None      DVT  Prophylaxis: SCD's  Code Status: DNR  Family Communication None at bedside  Procedures: None  CONSULTS:  Palliative care medicine  Time spent 20 minutes  MEDICATIONS: Scheduled Meds: . dexamethasone  8 mg Intravenous q morning - 10a  . feeding supplement (ENSURE ENLIVE)  237 mL Oral TID BM  . ferrous fumarate  1 tablet Oral BID  . insulin aspart  0-9 Units Subcutaneous TID WC  . LORazepam  1 mg Intravenous Q6H  . magnesium hydroxide  30 mL Oral Daily  . PRUTECT  1 application Topical Daily  . sodium chloride  10-40 mL Intracatheter Q12H  . sodium chloride  3 mL Intravenous Q12H   Continuous Infusions: . fentaNYL infusion INTRAVENOUS 300 mcg/hr (05/15/15 1117)   PRN Meds:.sodium chloride, baclofen, bisacodyl, EPINEPHrine, fentaNYL, insulin glargine, LORazepam, ondansetron, sodium chloride, sodium chloride, sodium phosphate, sorbitol    PHYSICAL EXAM: Vital signs in last 24 hours: Filed Vitals:   05/14/15 0658 05/14/15 1413 05/14/15 2305 05/15/15 0635  BP: 98/68 102/64 93/57 96/61   Pulse: 82 83 72 77  Temp: 97.4 F (36.3 C) 98.3 F (36.8 C) 98 F (36.7 C) 96.9 F (36.1 C)  TempSrc: Oral Oral Axillary Axillary  Resp: 12 14 12 12   Height:      Weight:      SpO2: 98% 94% 94% 95%    Weight change:  Filed Weights   05/08/15 2332  Weight: 72.576 kg (160 lb)   Body mass index is 25.05 kg/(m^2).   Physical exam: Patient refused physical examination today  Intake/Output from previous day:  Intake/Output Summary (Last 24 hours) at 05/15/15 1546 Last data filed at 05/15/15 0637  Gross per 24 hour  Intake 742.67 ml  Output    400 ml  Net 342.67 ml  LAB RESULTS: CBC  Recent Labs Lab 05/08/15 1638  WBC 16.3*  HGB 11.9*  HCT 36.1*  PLT 485*  MCV 82.0  MCH 27.0  MCHC 33.0  RDW 17.8*  LYMPHSABS 0.8  MONOABS 2.0*  EOSABS 0.0  BASOSABS 0.0    Chemistries   Recent Labs Lab 05/08/15 1638 05/08/15 1815  NA 126* 129*  K SPECIMEN  HEMOLYZED. HEMOLYSIS MAY AFFECT INTEGRITY OF RESULTS. 4.0  CL 84* 93*  CO2 32 29  GLUCOSE 262* 208*  BUN 8 7  CREATININE 0.87 0.58*  CALCIUM 8.7* 7.2*    CBG:  Recent Labs Lab 05/13/15 2111 05/14/15 0803 05/14/15 1212 05/14/15 1659 05/14/15 2314  GLUCAP 216* 125* 152* 170* 137*    GFR Estimated Creatinine Clearance: 91.8 mL/min (by C-G formula based on Cr of 0.58).  Coagulation profile No results for input(s): INR, PROTIME in the last 168 hours.  Cardiac Enzymes No results for input(s): CKMB, TROPONINI, MYOGLOBIN in the last 168 hours.  Invalid input(s): CK  Invalid input(s): POCBNP No results for input(s): DDIMER in the last 72 hours. No results for input(s): HGBA1C in the last 72 hours. No results for input(s): CHOL, HDL, LDLCALC, TRIG, CHOLHDL, LDLDIRECT in the last 72 hours. No results for input(s): TSH, T4TOTAL, T3FREE, THYROIDAB in the last 72 hours.  Invalid input(s): FREET3 No results for input(s): VITAMINB12, FOLATE, FERRITIN, TIBC, IRON, RETICCTPCT in the last 72 hours. No results for input(s): LIPASE, AMYLASE in the last 72 hours.  Urine Studies No results for input(s): UHGB, CRYS in the last 72 hours.  Invalid input(s): UACOL, UAPR, USPG, UPH, UTP, UGL, UKET, UBIL, UNIT, UROB, ULEU, UEPI, UWBC, URBC, UBAC, CAST, UCOM, BILUA  MICROBIOLOGY: No results found for this or any previous visit (from the past 240 hour(s)).  RADIOLOGY STUDIES/RESULTS: Dg Chest 2 View  04/25/2015   CLINICAL DATA:  Fever. Progressive abdominal pain and diarrhea for 2 days. History of liver cancer, status post radiation therapy.  EXAM: CHEST  2 VIEW  COMPARISON:  02/12/2015  FINDINGS: Numerous leads and wires project over the chest. Mild right hemidiaphragm elevation. Midline trachea. Remote left rib trauma. Mild cardiomegaly. No pleural effusion or pneumothorax. Bibasilar volume loss with atelectasis. No lobar consolidation. Small bowel air-fluid levels are nonspecific.  IMPRESSION:  Bibasilar volume loss with subsegmental atelectasis. No acute cardiopulmonary disease.  Nonspecific small bowel fluid levels.   Electronically Signed   By: Abigail Miyamoto M.D.   On: 04/25/2015 13:52   Dg Abd 1 View  05/12/2015   CLINICAL DATA:  Abdominal distension and pain  EXAM: ABDOMEN - 1 VIEW  COMPARISON:  04/25/2015  FINDINGS: Primarily gasless abdomen with small amount of air throughout colon. Diffuse increased attenuation over the abdomen with bulging flanks.  IMPRESSION: Nonobstructive gas pattern.  Possibility of ascites not excluded.   Electronically Signed   By: Skipper Cliche M.D.   On: 05/12/2015 10:30   Ct Abdomen Pelvis W Contrast  04/25/2015   CLINICAL DATA:  59 year old with recent completion of palliative radiation therapy for metastatic hepatocellular carcinoma, presenting with fever, steadily worsening generalized abdominal pain and diarrhea over the past 3 days.  EXAM: CT ABDOMEN AND PELVIS WITH CONTRAST  TECHNIQUE: Multidetector CT imaging of the abdomen and pelvis was performed using the standard protocol following bolus administration of intravenous contrast.  CONTRAST:  170mL OMNIPAQUE IOHEXOL 300 MG/ML IV. Oral contrast was also administered.  COMPARISON:  03/05/2015 and earlier.  FINDINGS: Hepatobiliary: Hepatic cirrhosis with relative enlargement of the left  lobe and caudate lobe as noted previously. Interval enlargement of previously identified multiple liver masses, as well as multiple new masses when compared to the most recent previous CT 03/05/2015, the largest mass in the lateral segment left lobe measuring approximately 2.6 x 3.4 cm (less than 1 cm on the prior CT). Patent portal vein. Mild wall thickening and enhancement of the mucosa of the gallbladder. No calcified gallstones. No pericholecystic inflammation.  Spleen:  Normal in size and appearance.  Pancreas:  Normal in appearance.  No pancreatic ductal dilation.  Adrenal glands: Normal left adrenal gland. Approximate 1.6  x 2.7 cm mass involving the right adrenal gland, increased in size since the prior CT.  Genitourinary: Nonobstructing approximate 2-3 mm calculus in an upper pole calix of the right kidney. No urinary tract calculi elsewhere on either side. No evidence of urinary tract obstruction. 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 Normal-appearing urinary bladder.  Prostate gland and seminal vesicles normal in size and appearance for age.  Gastrointestinal: Varices involving the distal esophagus. No intrinsic abnormality involving the stomach. Mild wall thickening involving the entire duodenum. Small bowel otherwise normal in appearance. Normal-appearing colon with moderate stool burden.  Ascites: Small amount of ascites dependently in the pelvis and in the perihepatic space.  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  Lymphatic: Bulky lymphadenopathy in the gastrohepatic ligament, hepatoduodenal ligament, porta hepatis, celiac axis and retroperitoneum of the upper abdomen with evidence of necrosis, likely related to the recent radiation therapy. Enlarged lymph nodes at the root of the mesentery have increased in size, and there is enlargement of left periaortic retroperitoneal lymph nodes. The lymphadenopathy causes moderate compression of the infrahepatic IVC and causes mass-effect upon the gastric antrum, duodenal bulb and descending duodenum.  Other findings: Small bilateral inguinal hernias containing fat. Increased edema and/or inflammation in the mesentery and the retroperitoneum since the prior examination.  Musculoskeletal: Intramedullary nail in the right femur. No evidence of osseous metastatic disease.  Visualized lower thorax: Heart mildly enlarged but stable. Atelectasis/scarring in the right middle lobe and both lower lobes associated with bronchiectasis, unchanged.  IMPRESSION: 1. Bulky lymphadenopathy in the abdomen as detailed above. Since the most  recent prior CT 03/05/2015, many of the nodes have become necrotic, likely as a result of radiation therapy. 2. Interval enlargement of previously identified liver masses and multiple new liver masses since the most recent prior CT. 3. Edema/inflammation involving the mesentery and the retroperitoneum, a new finding. This may be the result of radiation therapy or may reflect lymphatic obstruction related to the bulky lymphadenopathy. 4. Enlarging right adrenal metastasis. 5. Small amount of ascites in the pelvis and in the perihepatic space. 6. Stable mild gallbladder wall thickening which is likely related to the chronic hepatocellular disease. No calcified gallstones and no evidence of acute cholecystitis. 7. Stable atelectasis/scarring in the right middle lobe and both lower lobes with bronchiectasis.   Electronically Signed   By: Evangeline Dakin M.D.   On: 04/25/2015 15:53    Velvet Bathe, MD  [facility] Pager:[phone]  If 7PM-7AM, please contact night-coverage [email or web address] Password [email or web address] 05/15/2015, 3:46 PM   LOS: 6 days

## 2015-05-15 NOTE — Progress Notes (Signed)
   05/15/15 1200  Clinical Encounter Type  Visited With Patient and family together  Visit Type Follow-up;Psychological support;Spiritual support  Referral From Nurse;Palliative care team  Consult/Referral To Chaplain  Spiritual Encounters  Spiritual Needs Prayer;Emotional;Other (Comment) (Pastoral Conversation )  Stress Factors  Patient Stress Factors Health changes;Other (Comment) (Pain)  Family Stress Factors Health changes;Major life changes   The Chaplain has been following this patient during his stay at [facility]. The Chaplain followed up with the patient today, but the patient was in too much pain to interact. The patient was laying on his left side in an awkward position when the Chaplain arrived. His eyes were half open and he was unable to speak with the Chaplain. The patient's wife was sitting near the patient on the window seat. This visit was mainly with the patient's wife, but the patient seemed to understand that the Chaplain was in the room.  The patient's wife appeared to be tearful and stated that she had agreed to let her husband go to an inpatient hospice facility. She understands that her husbands prognosis is not good and is worried about his pain levels. She has been dealing with her own fibromyalgia and lupus, which is part of the reason why she is unable to take him home with her. The patient's wife requested prayer for her husband and for the family.  She feels that they have good support and she is expecting family to come in and out today.  Chaplain interventions included pastoral conversation, emotional/psychological support, spiritual assessment, and prayer.  If the patient is still in the hospital tomorrow, the Chaplain will follow-up again.

## 2015-05-15 NOTE — Progress Notes (Signed)
[facility] Liaison RN Visit  Met with family to discuss going home with [facility] services.  Family verbalized interest in [facility] due to difficulty with his pain being managed.  Wife, Jenny Reichmann stated she feels like [facility] is the best option.  Family agreeable to transfer tomorrow. CSW aware.  Registration paperwork completed.  Dr. Benay Spice to assume care per family request, as they feel he is most knowledgeable about the patient.  Please fax discharge summary to [phone].  RN please call report to [phone].  Please arrange transport to arrive before noon if possible.  Thank you, Annia Belt RN, [facility] Liaison [phone]

## 2015-05-15 NOTE — Progress Notes (Signed)
Daily Progress Note   Patient Name: Stuart Moore       Date: 05/15/2015 DOB: [birth year]  Age: 59 y.o. MRN#: [id number] Attending Physician: Velvet Bathe, MD Primary Care Physician: [place] Date: 05/08/2015  Reason for Consultation/Follow-up: Pain control and Psychosocial/spiritual support, disposition  Subjective:  -patient is sitting up in bed, leaning forward (daughter has arms wrapped around her father) wife at bedside, currently on Fentanyl gtt to control pain, patient continues to utilize breakthrough prn doses but reports "feeling better"  -wife now has made a decision to transfer patient to hospice facility in the morning, Stacie RN with [facility] is facilitating  Continue to adjust medications to enhance comfort, Dr Benay Spice aware  Length of Stay: 6 days  Current Medications: Scheduled Meds:  . dexamethasone  8 mg Intravenous q morning - 10a  . feeding supplement (ENSURE ENLIVE)  237 mL Oral TID BM  . ferrous fumarate  1 tablet Oral BID  . insulin aspart  0-9 Units Subcutaneous TID WC  . LORazepam  1 mg Intravenous Q6H  . magnesium hydroxide  30 mL Oral Daily  . PRUTECT  1 application Topical Daily  . sodium chloride  10-40 mL Intracatheter Q12H  . sodium chloride  3 mL Intravenous Q12H    Continuous Infusions: . fentaNYL infusion INTRAVENOUS 300 mcg/hr (05/15/15 1117)    PRN Meds: sodium chloride, baclofen, bisacodyl, EPINEPHrine, fentaNYL, insulin glargine, LORazepam, ondansetron, sodium chloride, sodium chloride, sodium phosphate, sorbitol  Palliative Performance Scale: 20%     Vital Signs: BP 96/61 mmHg  Pulse 77  Temp(Src) 96.9 F (36.1 C) (Axillary)  Resp 12  Ht 5\' 7"  (1.702 m)  Wt 72.576 kg (160 lb)  BMI 25.05 kg/m2  SpO2 95% SpO2: SpO2: 95 % O2 Device: O2 Device: Not Delivered O2 Flow Rate:    Intake/output summary:   Intake/Output Summary (Last 24 hours) at 05/15/15 1500 Last data filed at 05/15/15 7322  Gross per  24 hour  Intake 742.67 ml  Output    400 ml  Net 342.67 ml   LBM:   Baseline Weight: Weight: 72.576 kg (160 lb) Most recent weight: Weight: 72.576 kg (160 lb)  Physical Exam: General: more responsive today,  responsive, NAD currently HEENT: noted temporal muscle wasting, moist buccal membranes Abd: distended, no tenderness on gentle palpation CVS: RRR           Additional Data Reviewed: No results for input(s): WBC, HGB, PLT, NA, BUN, CREATININE, ALB in the last 72 hours.   Problem List:  Patient Active Problem List   Diagnosis Date Noted  . Adjustment disorder with mixed anxiety and depressed mood   . Cancer related pain 05/13/2015  . Abdominal distension   . Intractable pain   . Metastatic cancer   . Palliative care encounter   . Hyponatremia   . Other constipation   . Continuous severe abdominal pain 05/08/2015  . Liver mass, left lobe   . Diabetes mellitus due to underlying condition without complications   . Leukocytosis 07/01/2014  . Fever 06/29/2014  . Hepatocellular carcinoma 06/28/2014  . Renal stone 06/28/2014  . Hydronephrosis 06/28/2014  . Diabetes mellitus 06/28/2014  . Anxiety state 06/28/2014  . Essential hypertension 06/28/2014  . Abdominal pain 06/27/2014  . HEPATITIS C 11/10/2007  . 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 DEPRESSION 11/10/2007  . INTERNAL HEMORRHOIDS 11/10/2007  . GERD 11/10/2007  . HEMOCCULT POSITIVE STOOL 11/10/2007  . SLEEP APNEA 11/10/2007  . HEADACHE, CHRONIC  11/10/2007  . HEART MURMUR, BENIGN 11/10/2007  . FRACTURE, RIGHT LEG 11/10/2007     Palliative Care Assessment & Plan    Code Status:  DNR  Goals of Care:   Comfort and dignity, to dc to [facility] in am    Symptom Management:  Pain:  PICC                  Continue  Fentanyl gtt/300 mcg/hr and utilize boluses to enhance comfort,                                  Anxiety:  Ativan 1 mg  IV every 6 hrs scheduled and every 4 prn  Discussed with wife  natural trajectory and expectations at EOL as it relates to decreased mentaion, and role of medications for symptom managment    Psycho-social/Spiritual:  Emotional support offered, chaplain services involved   Prognosis:  Days to weeks   Care plan was discussed with  Dr.Sherrill  Thank you for allowing the Palliative Medicine Team to assist in the care of this patient.   Time In:  1420 Time Out: 1440 Total Time  20 min Prolonged Time Billed  no     Greater than 50%  of this time was spent counseling and coordinating care related to the above assessment and plan.   Knox Royalty, NP  05/15/2015, 3:00 PM  Please contact Palliative Medicine Team phone at [phone] for questions and concerns.

## 2015-05-16 DIAGNOSIS — C770 Secondary and unspecified malignant neoplasm of lymph nodes of head, face and neck: Secondary | ICD-10-CM

## 2015-05-16 DIAGNOSIS — C772 Secondary and unspecified malignant neoplasm of intra-abdominal lymph nodes: Secondary | ICD-10-CM

## 2015-05-16 DIAGNOSIS — R109 Unspecified abdominal pain: Secondary | ICD-10-CM

## 2015-05-16 DIAGNOSIS — C220 Liver cell carcinoma: Secondary | ICD-10-CM

## 2015-05-16 DIAGNOSIS — G893 Neoplasm related pain (acute) (chronic): Principal | ICD-10-CM

## 2015-05-16 MED ORDER — FENTANYL BOLUS VIA INFUSION: 150.0000 ug | INTRAVENOUS | Status: AC | PRN

## 2015-05-16 MED ORDER — SODIUM CHLORIDE 0.9 % IV SOLN: 400.0000 ug/h | INTRAVENOUS | Status: AC

## 2015-05-16 MED ORDER — LORAZEPAM 2 MG/ML IJ SOLN: 1.0000 mg | Freq: Four times a day (QID) | INTRAMUSCULAR | Status: AC

## 2015-05-16 NOTE — Progress Notes (Signed)
Patient discharged to [facility] via [place], report called to [place] at [facility].

## 2015-05-16 NOTE — Progress Notes (Signed)
IP PROGRESS NOTE  Subjective:   Stuart Moore is known to me with a history of metastatic hepatocellular carcinoma. He was admitted 05/08/2015 with severe abdominal pain. He has been evaluated by palliative care medicine. The pain is now better control with a fentanyl drip/bolus. He has been referred to the [facility] program and is scheduled for transfer to [place] today. I was asked to serve as the attending physician at [facility].  His wife is at the bedside's morning. I discussed the situation with her yesterday and again today. They have decided to proceed with comfort care.    Objective: Vital signs in last 24 hours: Blood pressure 98/60, pulse 70, temperature 97.6 F (36.4 C), temperature source Oral, resp. rate 14, height 5\' 7"  (1.702 m), weight 160 lb (72.576 kg), SpO2 95 %.  Intake/Output from previous day: 09/21 0701 - 09/22 0700 In: 1021.5 [I.V.:1021.5] Out: 600 [Urine:600]  Physical Exam:  Lungs: Clear anteriorly Cardiac: Regular rate and rhythm Abdomen: Mildly distended, tender in the upper abdomen Extremities: No leg edema Neurologic: Lethargic, arousable, speaks only a few words  Portacath/PICC-without erythema  Medications: I have reviewed the patient's current medications.  Assessment/Plan:  1. Metastatic hepatocellular carcinoma 2. Pain secondary to #1  Stuart Moore will be transferred to [facility] today. He will continue the fentanyl drip. The drip can be converted to morphine or Dilaudid per the discretion of the Hospice medical director.  I will plan on seeing Stuart Moore at [place] [facility] on 05/21/2015.  I recommend discontinuing nonessential medications and changing the lorazepam to as needed while at [facility].   LOS: 7 days   SHERRILL, Dominica Severin  05/16/2015, 8:19 AM

## 2015-05-16 NOTE — Progress Notes (Signed)
Wasted 90 ml Fentanyl 100 mcg per ml in sink with Alonna Buckler.

## 2015-05-16 NOTE — Progress Notes (Signed)
Wasted 10 ml fentanyl (171mcg) from a fentanyl drip down the sink with 2nd RN Larene Beach [facility]. Stuart Moore

## 2015-05-16 NOTE — Discharge Summary (Signed)
Physician Discharge Summary  Stuart Moore URK:[id number] DOB: May 28, 1956 DOA: 05/08/2015  PCP: [facility] date: 05/08/2015 Discharge date: 05/16/2015  Time spent: > 30  minutes  Recommendations for Outpatient Follow-up:  1. Please continue to ensure comfort care measures  Discharge Diagnoses:  Principal Problem:   Continuous severe abdominal pain Active Problems:   Abdominal pain   Hepatocellular carcinoma   Essential hypertension   Hyponatremia   Other constipation   Palliative care encounter   Metastatic cancer   Abdominal distension   Intractable pain   Cancer related pain   Adjustment disorder with mixed anxiety and depressed mood   Discharge Condition: Stable  Diet recommendation: Clear liquid diet/diabetic diet if able to advance  Filed Weights   05/08/15 2332  Weight: 72.576 kg (160 lb)    History of present illness: Hospital Course:  Hepatocellular carcinoma -Oncologist consulted and currently recommended the following:  1. Metastatic hepatocellular carcinoma 2. Pain secondary to #1  Mr. Foot will be transferred to [facility] today. He will continue the fentanyl drip. The drip can be converted to morphine or Dilaudid per the discretion of the Hospice medical director.  I will plan on seeing [name] at [facility] on 05/21/2015.  I recommend discontinuing nonessential medications and changing the lorazepam to as needed while at [facility].  As such I had to discontinue nonessential medications. Lantus will BE held as such patient can be monitored and if blood sugars rise Lantus may be continued. For now would treat with diet  Have also discontinued blood pressure medication  Procedures:  None  Consultations:  Oncology  Palliative care/hospice  Discharge Exam: Filed Vitals:   05/15/15 1400  BP: 98/60  Pulse: 70  Temp: 97.6 F (36.4 C)  Resp: 14    General: Patient in no acute distress, resting  comfortably Cardiovascular: No cyanosis Respiratory: No increased work of breathing, equal chest rise  Discharge Instructions   Discharge Instructions    Call MD for:  severe uncontrolled pain    Complete by:  As directed      Diet - low sodium heart healthy    Complete by:  As directed      Diet - low sodium heart healthy    Complete by:  As directed      Diet Carb Modified    Complete by:  As directed      Increase activity slowly    Complete by:  As directed      Increase activity slowly    Complete by:  As directed           Current Discharge Medication List    START taking these medications   Details  dexamethasone (DECADRON) 4 MG tablet Take 1 tablet (4 mg total) by mouth daily. Qty: 30 tablet, Refills: 0    !! feeding supplement, ENSURE ENLIVE, (ENSURE ENLIVE) LIQD Take 237 mLs by mouth 3 (three) times daily between meals. Qty: 90 Bottle, Refills: 0    fentaNYL (SUBLIMAZE) SOLN Inject 150 mcg into the vein every hour as needed. Refills: 0    fentaNYL 2,500 mcg in sodium chloride 0.9 % 200 mL Inject 400 mcg/hr into the vein continuous.    LORazepam (ATIVAN) 2 MG/ML injection Inject 0.5 mLs (1 mg total) into the vein every 6 (six) hours. Qty: 1 mL, Refills: 0    ondansetron (ZOFRAN) 4 MG tablet Take 1 tablet (4 mg total) by mouth every 8 (eight) hours as needed  for nausea or vomiting. Qty: 20 tablet, Refills: 0     !! - Potential duplicate medications found. Please discuss with Ademide Schaberg.    CONTINUE these medications which have CHANGED   Details  baclofen (LIORESAL) 10 MG tablet Take 1 tablet (10 mg total) by mouth 2 (two) times daily. Qty: 30 each, Refills: 0      CONTINUE these medications which have NOT CHANGED   Details  emollient (BIAFINE) cream Apply 1 application topically daily.    !! ENSURE PLUS (ENSURE PLUS) LIQD Take 237 mLs by mouth 3 (three) times daily between meals.    EPINEPHrine (EPIPEN 2-PAK) 0.3 mg/0.3 mL IJ SOAJ injection Inject 0.3  mg into the muscle as needed (allergies).     !! - Potential duplicate medications found. Please discuss with [place].    STOP taking these medications     amLODipine (NORVASC) 10 MG tablet      ferrous fumarate (HEMOCYTE - 106 MG FE) 325 (106 FE) MG TABS tablet      HYDROmorphone (DILAUDID) 2 MG tablet      ibuprofen (ADVIL,MOTRIN) 200 MG tablet      insulin glargine (LANTUS) 100 UNIT/ML injection      lactulose (CHRONULAC) 10 GM/15ML solution      lisinopril (PRINIVIL,ZESTRIL) 40 MG tablet      morphine (MS CONTIN) 15 MG 12 hr tablet      morphine (MS CONTIN) 60 MG 12 hr tablet      omeprazole (PRILOSEC) 20 MG capsule      propranolol (INDERAL) 20 MG tablet      sorbitol 70 % solution      traZODone (DESYREL) 50 MG tablet      Oxycodone HCl 10 MG TABS      polyethylene glycol (MIRALAX / GLYCOLAX) packet        Allergies  Allergen Reactions  . Bee Venom Anaphylaxis   Follow-up Information    Follow up with [facility]. Schedule an appointment as soon as possible for a visit in 2 weeks.   Specialty:  General Practice   Contact information:   [place] [address] [phone]       Follow up with Betsy Coder, MD. Schedule an appointment as soon as possible for a visit in 2 weeks.   Specialty:  Oncology   Contact information:   [place] [address] [phone]        The results of significant diagnostics from this hospitalization (including imaging, microbiology, ancillary and laboratory) are listed below for reference.    Significant Diagnostic Studies: Dg Chest 2 View  04/25/2015   CLINICAL DATA:  Fever. Progressive abdominal pain and diarrhea for 2 days. History of liver cancer, status post radiation therapy.  EXAM: CHEST  2 VIEW  COMPARISON:  02/12/2015  FINDINGS: Numerous leads and wires project over the chest. Mild right hemidiaphragm elevation. Midline trachea. Remote left rib trauma. Mild  cardiomegaly. No pleural effusion or pneumothorax. Bibasilar volume loss with atelectasis. No lobar consolidation. Small bowel air-fluid levels are nonspecific.  IMPRESSION: Bibasilar volume loss with subsegmental atelectasis. No acute cardiopulmonary disease.  Nonspecific small bowel fluid levels.   Electronically Signed   By: Abigail Miyamoto M.D.   On: 04/25/2015 13:52   Dg Abd 1 View  05/12/2015   CLINICAL DATA:  Abdominal distension and pain  EXAM: ABDOMEN - 1 VIEW  COMPARISON:  04/25/2015  FINDINGS: Primarily gasless abdomen with small amount of air  throughout colon. Diffuse increased attenuation over the abdomen with bulging flanks.  IMPRESSION: Nonobstructive gas pattern.  Possibility of ascites not excluded.   Electronically Signed   By: Skipper Cliche M.D.   On: 05/12/2015 10:30   Ct Abdomen Pelvis W Contrast  04/25/2015   CLINICAL DATA:  59 year old with recent completion of palliative radiation therapy for metastatic hepatocellular carcinoma, presenting with fever, steadily worsening generalized abdominal pain and diarrhea over the past 3 days.  EXAM: CT ABDOMEN AND PELVIS WITH CONTRAST  TECHNIQUE: Multidetector CT imaging of the abdomen and pelvis was performed using the standard protocol following bolus administration of intravenous contrast.  CONTRAST:  163mL OMNIPAQUE IOHEXOL 300 MG/ML IV. Oral contrast was also administered.  COMPARISON:  03/05/2015 and earlier.  FINDINGS: Hepatobiliary: Hepatic cirrhosis with relative enlargement of the left lobe and caudate lobe as noted previously. Interval enlargement of previously identified multiple liver masses, as well as multiple new masses when compared to the most recent previous CT 03/05/2015, the largest mass in the lateral segment left lobe measuring approximately 2.6 x 3.4 cm (less than 1 cm on the prior CT). Patent portal vein. Mild wall thickening and enhancement of the mucosa of the gallbladder. No calcified gallstones. No pericholecystic  inflammation.  Spleen:  Normal in size and appearance.  Pancreas:  Normal in appearance.  No pancreatic ductal dilation.  Adrenal glands: Normal left adrenal gland. Approximate 1.6 x 2.7 cm mass involving the right adrenal gland, increased in size since the prior CT.  Genitourinary: Nonobstructing approximate 2-3 mm calculus in an upper pole calix of the right kidney. No urinary tract calculi elsewhere on either side. No evidence of urinary tract obstruction. 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 Normal-appearing urinary bladder.  Prostate gland and seminal vesicles normal in size and appearance for age.  Gastrointestinal: Varices involving the distal esophagus. No intrinsic abnormality involving the stomach. Mild wall thickening involving the entire duodenum. Small bowel otherwise normal in appearance. Normal-appearing colon with moderate stool burden.  Ascites: Small amount of ascites dependently in the pelvis and in the perihepatic space.  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  Lymphatic: Bulky lymphadenopathy in the gastrohepatic ligament, hepatoduodenal ligament, porta hepatis, celiac axis and retroperitoneum of the upper abdomen with evidence of necrosis, likely related to the recent radiation therapy. Enlarged lymph nodes at the root of the mesentery have increased in size, and there is enlargement of left periaortic retroperitoneal lymph nodes. The lymphadenopathy causes moderate compression of the infrahepatic IVC and causes mass-effect upon the gastric antrum, duodenal bulb and descending duodenum.  Other findings: Small bilateral inguinal hernias containing fat. Increased edema and/or inflammation in the mesentery and the retroperitoneum since the prior examination.  Musculoskeletal: Intramedullary nail in the right femur. No evidence of osseous metastatic disease.  Visualized lower thorax: Heart mildly enlarged but stable.  Atelectasis/scarring in the right middle lobe and both lower lobes associated with bronchiectasis, unchanged.  IMPRESSION: 1. Bulky lymphadenopathy in the abdomen as detailed above. Since the most recent prior CT 03/05/2015, many of the nodes have become necrotic, likely as a result of radiation therapy. 2. Interval enlargement of previously identified liver masses and multiple new liver masses since the most recent prior CT. 3. Edema/inflammation involving the mesentery and the retroperitoneum, a new finding. This may be the result of radiation therapy or may reflect lymphatic obstruction related to the bulky lymphadenopathy. 4. Enlarging right adrenal metastasis. 5. Small amount of ascites in the pelvis and in  the perihepatic space. 6. Stable mild gallbladder wall thickening which is likely related to the chronic hepatocellular disease. No calcified gallstones and no evidence of acute cholecystitis. 7. Stable atelectasis/scarring in the right middle lobe and both lower lobes with bronchiectasis.   Electronically Signed   By: Evangeline Dakin M.D.   On: 04/25/2015 15:53    Microbiology: No results found for this or any previous visit (from the past 240 hour(s)).   Labs: Basic Metabolic Panel: No results for input(s): NA, K, CL, CO2, GLUCOSE, BUN, CREATININE, CALCIUM, MG, PHOS in the last 168 hours. Liver Function Tests: No results for input(s): AST, ALT, ALKPHOS, BILITOT, PROT, ALBUMIN in the last 168 hours. No results for input(s): LIPASE, AMYLASE in the last 168 hours. No results for input(s): AMMONIA in the last 168 hours. CBC: No results for input(s): WBC, NEUTROABS, HGB, HCT, MCV, PLT in the last 168 hours. Cardiac Enzymes: No results for input(s): CKTOTAL, CKMB, CKMBINDEX, TROPONINI in the last 168 hours. BNP: BNP (last 3 results) No results for input(s): BNP in the last 8760 hours.  ProBNP (last 3 results) No results for input(s): PROBNP in the last 8760 hours.  CBG:  Recent  Labs Lab 05/13/15 2111 05/14/15 0803 05/14/15 1212 05/14/15 1659 05/14/15 2314  GLUCAP 216* 125* 152* 170* 137*       Signed:  Velvet Bathe  [facility] 05/16/2015, 12:45 PM

## 2015-05-16 NOTE — Care Management Important Message (Signed)
Important Message  Patient Details  Name: Stuart Moore MRN: [id number] Date of Birth: 07-18-1956   Medicare Important Message Given:  Yes-third notification given    Camillo Flaming 05/16/2015, 10:37 AMImportant Message  Patient Details  Name: Stuart Moore MRN: [id number] Date of Birth: [birth year]   Medicare Important Message Given:  Yes-third notification given    Camillo Flaming 05/16/2015, 10:37 AM

## 2015-05-16 NOTE — Progress Notes (Signed)
CSW received notification that plan changed from home with [facility] to [facility]. [facility] bed available today.  CSW facilitated pt discharge needs including confirming discharge summary faxed by [facility], Erling Conte, providing RN phone number to call report, discussing with pt and pt wife at bedside, support provided, and arranging ambulance transport for pt to [facility].  No further social work needs identified at this time.  CSW signing off.   Alison Murray, MSW, [place] [employer] [phone]

## 2015-05-29 ENCOUNTER — Telehealth: Payer: Self-pay | Admitting: *Deleted

## 2015-05-29 NOTE — Telephone Encounter (Signed)
On 06/21/2015 [facility] Roll[facility] Fare it was consult note, end of tx note, follow up note,

## 2015-05-29 NOTE — Telephone Encounter (Signed)
Hospice called reporting "patient passed away at 3:30 pm today."  Will notify Dr. Benay Spice

## 2015-06-25 DEATH — deceased

## 2015-09-12 ENCOUNTER — Ambulatory Visit
Admission: RE | Admit: 2015-09-12 | Discharge: 2015-09-12 | Disposition: A | Discharge status: Home / Self Care | Payer: Medicare Other | Source: Ambulatory Visit | Attending: Radiation Oncology | Admitting: Radiation Oncology

## 2015-09-12 ENCOUNTER — Ambulatory Visit: Admission: RE | Admit: 2015-09-12 | Payer: Medicare Other | Source: Ambulatory Visit | Admitting: Radiation Oncology

## 2015-09-12 ENCOUNTER — Ambulatory Visit: Payer: Self-pay | Admitting: Radiation Oncology

## 2016-11-12 IMAGING — CT CT ABD-PELV W/ CM
2 of 5 series · 15 of 46 positions shown, 17 images · IV contrast (omnipaque)
Comparison: CT 12/20/2014

CLINICAL DATA: Nausea, vomiting, lower and left lateral abdominal
pain since this morning. History of kidney stones. History of
cirrhosis, hepatitis. Stage IV liver cancer with metastases.

EXAM:
CT ABDOMEN AND PELVIS WITH CONTRAST
TECHNIQUE: Multidetector CT imaging of the abdomen and pelvis was performed
using the standard protocol following bolus administration of
intravenous contrast.
CONTRAST:  100mL OMNIPAQUE IOHEXOL 300 MG/ML  SOLN

[Series 2: abd/pel with · axial · 0.78mm/px · z∈[-439,-14]mm · 12 of 96 slices shown, 14 images]
[im 6/96  soft-tissue]
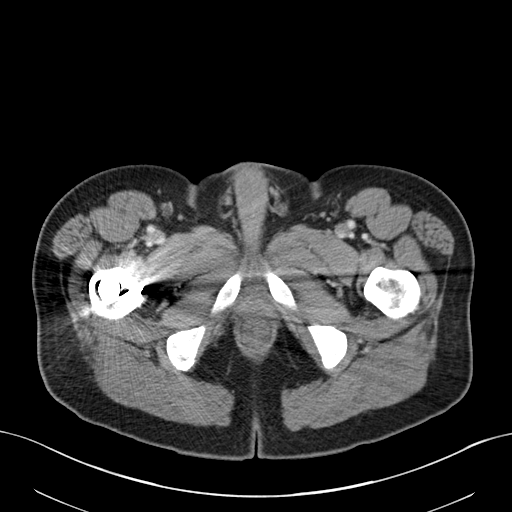
[im 6/96  bone]
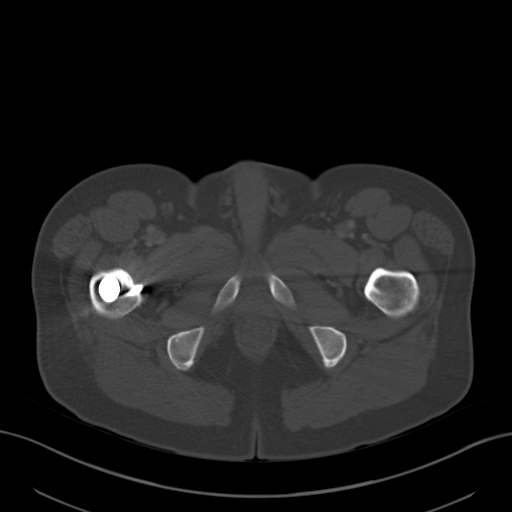
[im 16/96  soft-tissue]
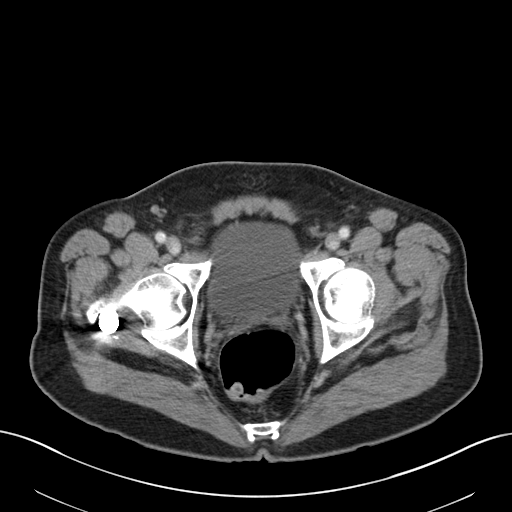
[im 21/96  soft-tissue]
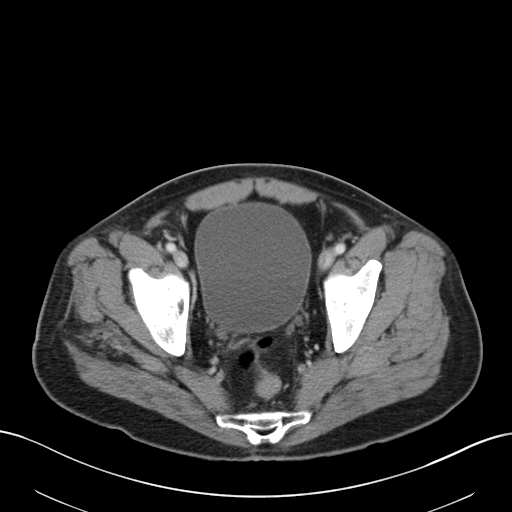
[im 31/96  soft-tissue]
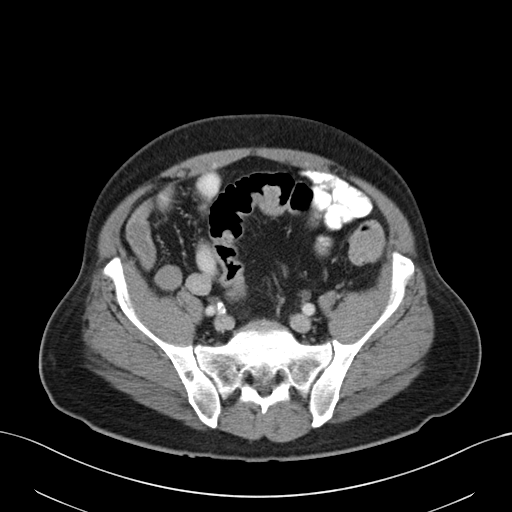
[im 36/96  soft-tissue]
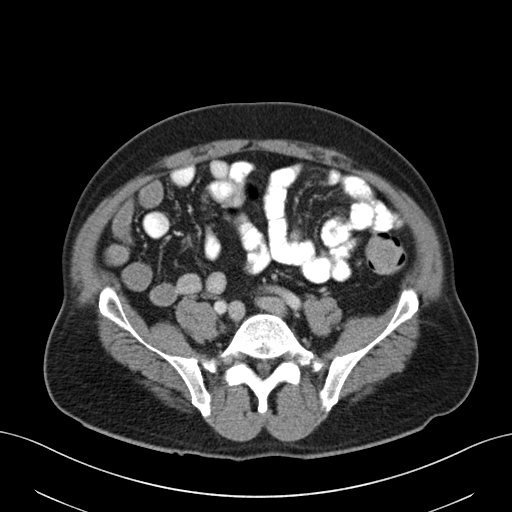
[im 46/96  soft-tissue]
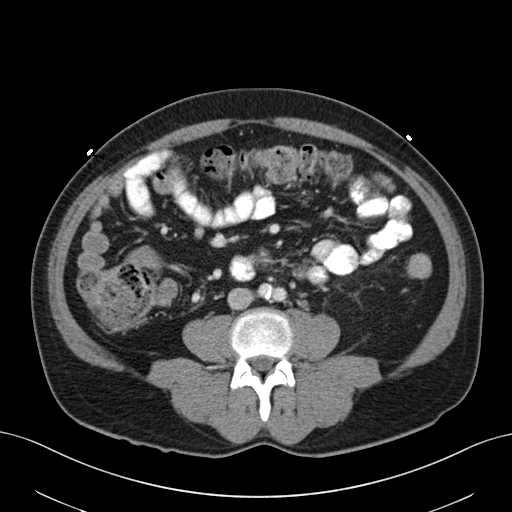
[im 51/96  soft-tissue]
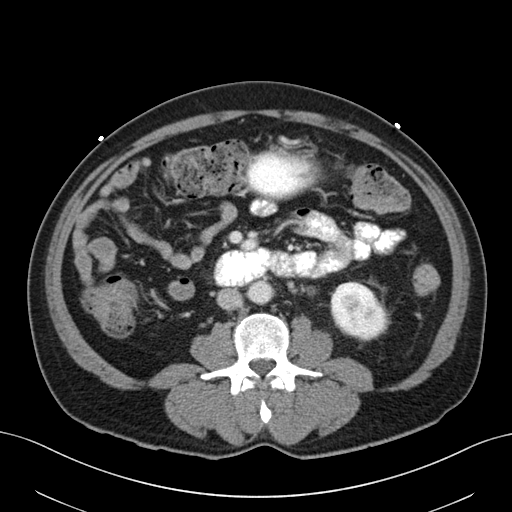
[im 61/96  soft-tissue]
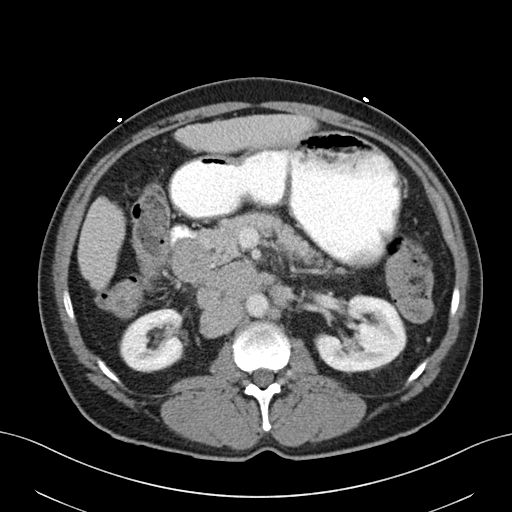
[im 66/96  soft-tissue]
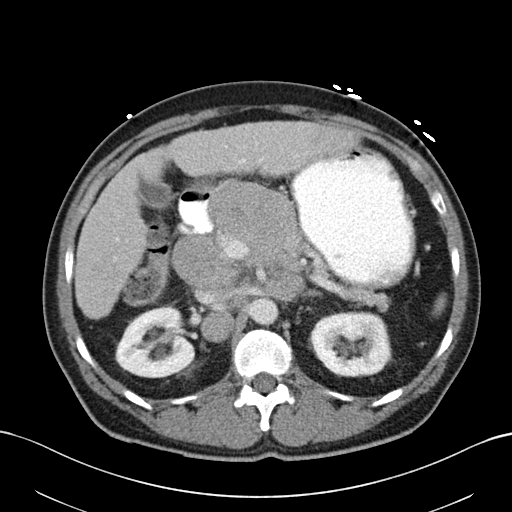
[im 66/96  bone]
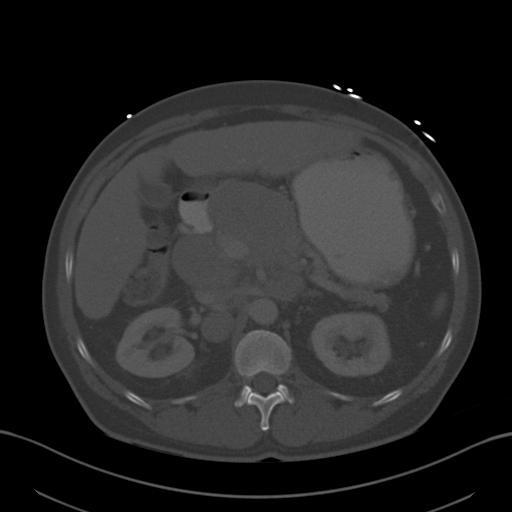
[im 76/96  soft-tissue]
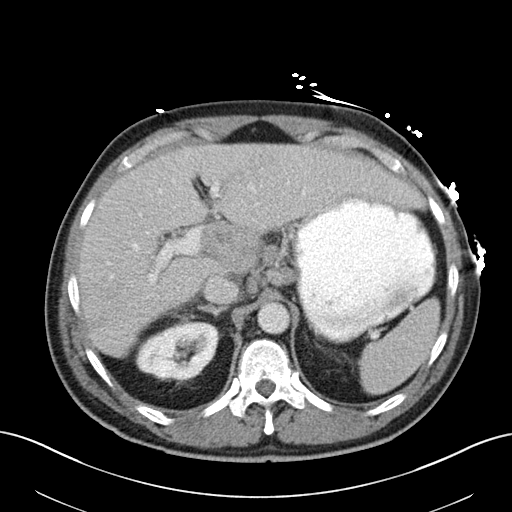
[im 81/96  soft-tissue]
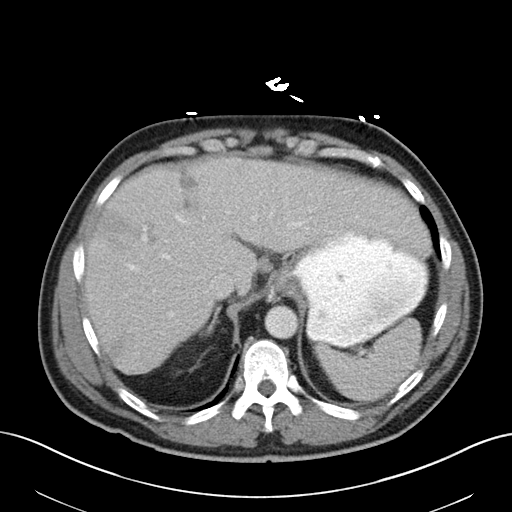
[im 91/96  soft-tissue]
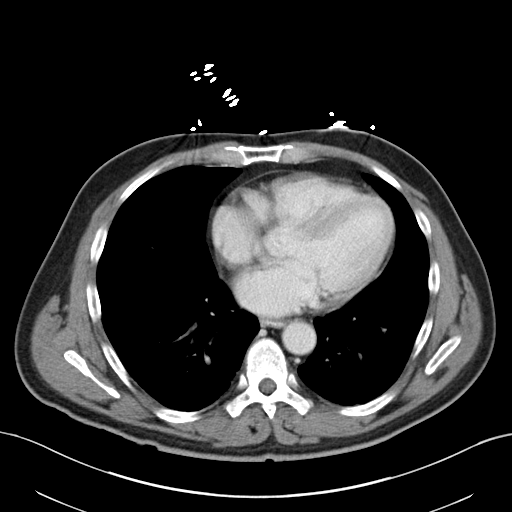

[Series 3: coronal a/|p · coronal · 0.74mm/px · 3 of 93 slices shown]
[im 31/93  soft-tissue]
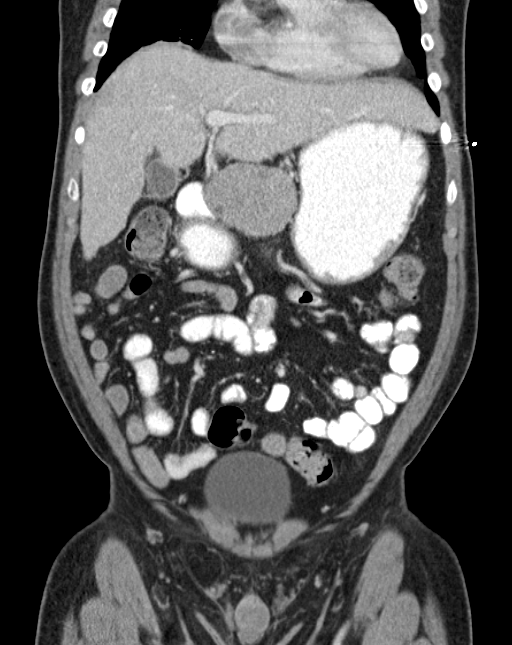
[im 41/93  soft-tissue]
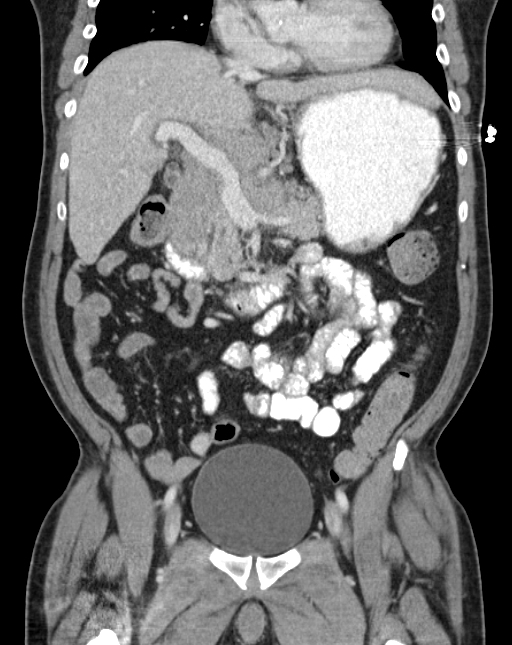
[im 52/93  soft-tissue]
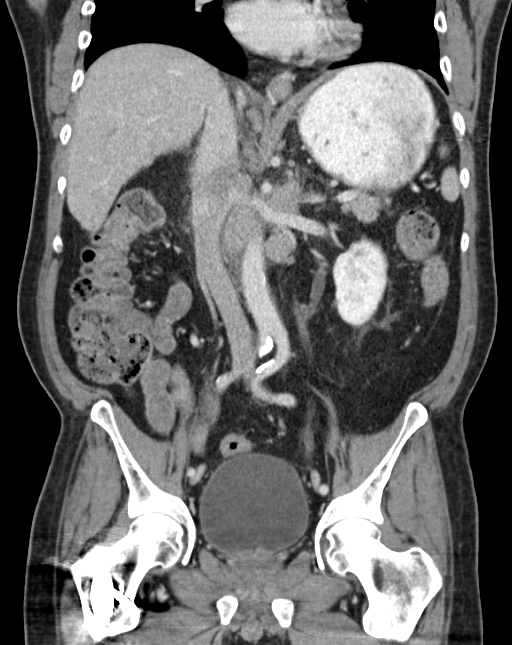

[15 of 46 positions shown; findings below may reference images not displayed]

FINDINGS: Lower chest: Linear areas of atelectasis in the lung bases. Heart is
normal size. No effusions.

Hepatobiliary: Low-density areas noted in the right hepatic lobe on
image 15, stable and image 16 more laterally. This is in the area
previously seen early arterial enhancement on prior three-phase CT
and may reflect an area of hypoperfusion. This low-density area was
not as well visualized on prior study. Hyperdense area more
posteriorly in the right hepatic lobe on image 16. No well-defined
lesions in these areas.

Pancreas: No focal abnormality or ductal dilatation.

Spleen: No focal abnormality.  Normal size.

Adrenals/Urinary Tract: No focal renal or adrenal abnormality. No
hydronephrosis. Urinary bladder is unremarkable. Duplicated right
renal collecting system and at least proximal ureter.

Stomach/Bowel: Esophageal varices are noted adjacent to the proximal
stomach and distal esophagus. Stomach and small bowel are
unremarkable. Large bowel unremarkable. Appendix is visualized and
unremarkable.

Vascular/Lymphatic: Bulky upper abdominal adenopathy again noted.
Node anterior to the portal vein measures 6.8 x 6.0 cm compared with
7.2 x 5.8 cm previously. Lymph node posterior to the portal vein
measures 5.6 x 4.1 cm compared with 5.1 x 4.1 cm previously. Other
bulky retroperitoneal lymph nodes appear similar. Continued mass
effect on the portal vein without evidence of occlusion or
thrombosis. Splenic vein remains patent. The adenopathy also encases
the hepatic artery which appears patent. No evidence of aortic
aneurysm.

Reproductive: No mass or other significant abnormality.

Other: No free fluid or free air.

Musculoskeletal: No focal bone lesion or acute bony abnormality.
IMPRESSION: Bulky upper abdominal and retroperitoneal adenopathy is similar to
prior study.

Areas of decreased attenuation within the right hepatic lobe were
not definitively visualized on prior studies. This may reflect areas
of altered profusion. No well-defined mass lesion in these areas.
Low-density area within the anterior right hepatic lobe is stable.

No acute findings in the abdomen or pelvis.
